# Patient Record
Sex: Female | Born: 1960 | Hispanic: Yes | Marital: Married | Smoking: Never smoker
Health system: Southern US, Community
[De-identification: ages and names within clinical notes are randomized; demographics above are authoritative.]

## PROBLEM LIST (undated history)

## (undated) DIAGNOSIS — E785 Hyperlipidemia, unspecified: Secondary | ICD-10-CM

## (undated) DIAGNOSIS — E119 Type 2 diabetes mellitus without complications: Secondary | ICD-10-CM

## (undated) HISTORY — DX: Type 2 diabetes mellitus without complications: E11.9

## (undated) HISTORY — PX: TUBAL LIGATION: SHX77

## (undated) HISTORY — DX: Hyperlipidemia, unspecified: E78.5

---

## 2012-01-19 LAB — HM DIABETES EYE EXAM

## 2012-10-31 LAB — HM PAP SMEAR

## 2013-01-18 ENCOUNTER — Telehealth: Payer: Self-pay | Admitting: Internal Medicine

## 2013-01-18 ENCOUNTER — Encounter: Payer: Self-pay | Admitting: Internal Medicine

## 2013-01-18 ENCOUNTER — Other Ambulatory Visit (INDEPENDENT_AMBULATORY_CARE_PROVIDER_SITE_OTHER): Payer: BC Managed Care – PPO

## 2013-01-18 ENCOUNTER — Ambulatory Visit (INDEPENDENT_AMBULATORY_CARE_PROVIDER_SITE_OTHER): Payer: BC Managed Care – PPO | Admitting: Internal Medicine

## 2013-01-18 VITALS — BP 128/96 | HR 92 | Temp 98.0°F | Resp 16 | Ht 61.0 in | Wt 154.0 lb

## 2013-01-18 DIAGNOSIS — E1139 Type 2 diabetes mellitus with other diabetic ophthalmic complication: Secondary | ICD-10-CM | POA: Insufficient documentation

## 2013-01-18 DIAGNOSIS — Z0000 Encounter for general adult medical examination without abnormal findings: Secondary | ICD-10-CM

## 2013-01-18 DIAGNOSIS — IMO0001 Reserved for inherently not codable concepts without codable children: Secondary | ICD-10-CM

## 2013-01-18 DIAGNOSIS — Z23 Encounter for immunization: Secondary | ICD-10-CM

## 2013-01-18 DIAGNOSIS — E1169 Type 2 diabetes mellitus with other specified complication: Secondary | ICD-10-CM | POA: Insufficient documentation

## 2013-01-18 DIAGNOSIS — E785 Hyperlipidemia, unspecified: Secondary | ICD-10-CM

## 2013-01-18 DIAGNOSIS — Z1231 Encounter for screening mammogram for malignant neoplasm of breast: Secondary | ICD-10-CM | POA: Insufficient documentation

## 2013-01-18 LAB — HEMOGLOBIN A1C: Hgb A1c MFr Bld: 11.8 % — ABNORMAL HIGH (ref 4.6–6.5)

## 2013-01-18 LAB — LDL CHOLESTEROL, DIRECT: Direct LDL: 111.5 mg/dL

## 2013-01-18 LAB — URINALYSIS, ROUTINE W REFLEX MICROSCOPIC
Bilirubin Urine: NEGATIVE
Ketones, ur: NEGATIVE
Total Protein, Urine: NEGATIVE
pH: 5 (ref 5.0–8.0)

## 2013-01-18 LAB — MICROALBUMIN / CREATININE URINE RATIO: Microalb, Ur: 2.2 mg/dL — ABNORMAL HIGH (ref 0.0–1.9)

## 2013-01-18 LAB — CBC WITH DIFFERENTIAL/PLATELET
Basophils Relative: 0.7 % (ref 0.0–3.0)
Eosinophils Relative: 2.2 % (ref 0.0–5.0)
Hemoglobin: 14.2 g/dL (ref 12.0–15.0)
Lymphocytes Relative: 31.2 % (ref 12.0–46.0)
MCV: 83.9 fl (ref 78.0–100.0)
Monocytes Absolute: 0.4 10*3/uL (ref 0.1–1.0)
Neutro Abs: 4.8 10*3/uL (ref 1.4–7.7)
Neutrophils Relative %: 61.2 % (ref 43.0–77.0)
RBC: 5.01 Mil/uL (ref 3.87–5.11)
WBC: 7.9 10*3/uL (ref 4.5–10.5)

## 2013-01-18 LAB — LIPID PANEL
HDL: 38.8 mg/dL — ABNORMAL LOW (ref >39.00)
Total CHOL/HDL Ratio: 5
Triglycerides: 219 mg/dL — ABNORMAL HIGH (ref 0.0–149.0)

## 2013-01-18 LAB — COMPREHENSIVE METABOLIC PANEL
Albumin: 3.8 g/dL (ref 3.5–5.2)
BUN: 12 mg/dL (ref 6–23)
Calcium: 9.1 mg/dL (ref 8.4–10.5)
Chloride: 101 mEq/L (ref 96–112)
Glucose, Bld: 281 mg/dL — ABNORMAL HIGH (ref 70–99)
Potassium: 4.3 mEq/L (ref 3.5–5.1)

## 2013-01-18 LAB — TSH: TSH: 1.55 u[IU]/mL (ref 0.35–5.50)

## 2013-01-18 LAB — HM DIABETES FOOT EXAM

## 2013-01-18 MED ORDER — SITAGLIP PHOS-METFORMIN HCL ER 100-1000 MG PO TB24: 1.0000 | ORAL_TABLET | Freq: Every day | ORAL | Status: DC

## 2013-01-18 MED ORDER — ONETOUCH VERIO IQ SYSTEM W/DEVICE KIT: 1.0000 | PACK | Freq: Two times a day (BID) | Status: DC

## 2013-01-18 MED ORDER — GLUCOSE BLOOD VI STRP: ORAL_STRIP | Status: DC

## 2013-01-18 NOTE — Assessment & Plan Note (Signed)
[facility] CMP today 

## 2013-01-18 NOTE — Assessment & Plan Note (Signed)
Exam done  Vaccines were updated, she refused a flu vax She was referred for a colonoscopy and mammo PAP was done 2 months ago by her GYN Labs ordered Pt ed material was given

## 2013-01-18 NOTE — Telephone Encounter (Signed)
Rec'd from [facility] forward 29 pages to Dr. Yetta Barre

## 2013-01-18 NOTE — Progress Notes (Signed)
Subjective:    Patient ID: Sharon Kane, female    DOB: 11-04-1960, 52 y.o.   MRN: [id number]  Diabetes She presents for her follow-up diabetic visit. She has type 2 diabetes mellitus. The initial diagnosis of diabetes was made 13 years ago. Her disease course has been fluctuating. There are no hypoglycemic associated symptoms. Pertinent negatives for hypoglycemia include no dizziness, headaches, seizures, speech difficulty or tremors. Pertinent negatives for diabetes include no blurred vision, no chest pain, no fatigue, no foot paresthesias, no foot ulcerations, no polydipsia, no polyphagia, no polyuria, no visual change, no weakness and no weight loss. There are no hypoglycemic complications. Symptoms are stable. There are no diabetic complications. Current diabetic treatment includes oral agent (monotherapy). She is compliant with treatment most of the time. Her weight is stable. She is following a generally healthy diet. Meal planning includes avoidance of concentrated sweets. She has not had a previous visit with a dietician. She participates in exercise intermittently. There is no change in her home blood glucose trend. An ACE inhibitor/angiotensin II receptor blocker is not being taken. She does not see a podiatrist.Eye exam is not current.      Review of Systems  Constitutional: Negative.  Negative for fever, chills, weight loss, diaphoresis, activity change, appetite change, fatigue and unexpected weight change.  HENT: Negative.   Eyes: Negative.  Negative for blurred vision.  Respiratory: Negative.  Negative for apnea, cough, choking, chest tightness, shortness of breath, wheezing and stridor.   Cardiovascular: Negative.  Negative for chest pain, palpitations and leg swelling.  Gastrointestinal: Positive for diarrhea (she feels like the TID dosing of metformin causes diarrhea). Negative for nausea, vomiting, abdominal pain, constipation, blood in stool, abdominal distention, anal  bleeding and rectal pain.  Endocrine: Negative.  Negative for polydipsia, polyphagia and polyuria.  Genitourinary: Negative.   Musculoskeletal: Negative.   Skin: Negative.   Allergic/Immunologic: Negative.   Neurological: Negative.  Negative for dizziness, tremors, seizures, syncope, facial asymmetry, speech difficulty, weakness, light-headedness, numbness and headaches.  Hematological: Negative.  Negative for adenopathy. Does not bruise/bleed easily.  Psychiatric/Behavioral: Negative.        Objective:   Physical Exam  Vitals reviewed. Constitutional: She is oriented to person, place, and time. She appears well-developed and well-nourished. No distress.  HENT:  Head: Normocephalic and atraumatic.  Mouth/Throat: Oropharynx is clear and moist. No oropharyngeal exudate.  Eyes: Conjunctivae are normal. Right eye exhibits no discharge. Left eye exhibits no discharge. No scleral icterus.  Neck: Normal range of motion. Neck supple. No JVD present. No tracheal deviation present. No thyromegaly present.  Cardiovascular: Normal rate, regular rhythm, normal heart sounds and intact distal pulses.  Exam reveals no gallop and no friction rub.   No murmur heard. Pulmonary/Chest: Effort normal and breath sounds normal. No accessory muscle usage or stridor. Not tachypneic. No respiratory distress. She has no decreased breath sounds. She has no wheezes. She has no rhonchi. She has no rales. Chest wall is not dull to percussion. She exhibits no mass, no tenderness, no bony tenderness, no laceration, no crepitus, no edema, no deformity, no swelling and no retraction. Right breast exhibits no inverted nipple, no mass, no nipple discharge, no skin change and no tenderness. Left breast exhibits no inverted nipple, no mass, no nipple discharge, no skin change and no tenderness. Breasts are symmetrical.  Abdominal: Soft. Bowel sounds are normal. She exhibits no distension and no mass. There is no tenderness. There  is no rebound and no guarding.  Musculoskeletal: Normal  range of motion. She exhibits no edema and no tenderness.  Lymphadenopathy:    She has no cervical adenopathy.  Neurological: She is oriented to person, place, and time.  Skin: Skin is warm and dry. No rash noted. She is not diaphoretic. No erythema. No pallor.  Psychiatric: She has a normal mood and affect. Her behavior is normal. Judgment and thought content normal.      No results found for this basename: WBC, HGB, HCT, PLT, GLUCOSE, CHOL, TRIG, HDL, LDLDIRECT, LDLCALC, ALT, AST, NA, K, CL, CREATININE, BUN, CO2, TSH, PSA, INR, GLUF, HGBA1C, MICROALBUR      Assessment & Plan:

## 2013-01-18 NOTE — Patient Instructions (Signed)
Type 2 Diabetes Mellitus, Adult Type 2 diabetes mellitus, often simply referred to as type 2 diabetes, is a long-lasting (chronic) disease. In type 2 diabetes, the pancreas does not make enough insulin (a hormone), the cells are less responsive to the insulin that is made (insulin resistance), or both. Normally, insulin moves sugars from food into the tissue cells. The tissue cells use the sugars for energy. The lack of insulin or the lack of normal response to insulin causes excess sugars to build up in the blood instead of going into the tissue cells. As a result, high blood sugar (hyperglycemia) develops. The effect of high sugar (glucose) levels can cause many complications. Type 2 diabetes was also previously called adult-onset diabetes but it can occur at any age.  RISK FACTORS  A person is predisposed to developing type 2 diabetes if someone in the family has the disease and also has one or more of the following primary risk factors:  Overweight.  An inactive lifestyle.  A history of consistently eating high-calorie foods. Maintaining a normal weight and regular physical activity can reduce the chance of developing type 2 diabetes. SYMPTOMS  A person with type 2 diabetes may not show symptoms initially. The symptoms of type 2 diabetes appear slowly. The symptoms include:  Increased thirst (polydipsia).  Increased urination (polyuria).  Increased urination during the night (nocturia).  Weight loss. This weight loss may be rapid.  Frequent, recurring infections.  Tiredness (fatigue).  Weakness.  Vision changes, such as blurred vision.  Fruity smell to your breath.  Abdominal pain.  Nausea or vomiting.  Cuts or bruises which are slow to heal.  Tingling or numbness in the hands or feet. DIAGNOSIS Type 2 diabetes is frequently not diagnosed until complications of diabetes are present. Type 2 diabetes is diagnosed when symptoms or complications are present and when blood  glucose levels are increased. Your blood glucose level may be checked by one or more of the following blood tests:  A fasting blood glucose test. You will not be allowed to eat for at least 8 hours before a blood sample is taken.  A random blood glucose test. Your blood glucose is checked at any time of the day regardless of when you ate.  A hemoglobin A1c blood glucose test. A hemoglobin A1c test provides information about blood glucose control over the previous 3 months.  An oral glucose tolerance test (OGTT). Your blood glucose is measured after you have not eaten (fasted) for 2 hours and then after you drink a glucose-containing beverage. TREATMENT   You may need to take insulin or diabetes medicine daily to keep blood glucose levels in the desired range.  You will need to match insulin dosing with exercise and healthy food choices. The treatment goal is to maintain the before meal blood sugar (preprandial glucose) level at 70 130 mg/dL. HOME CARE INSTRUCTIONS   Have your hemoglobin A1c level checked twice a year.  Perform daily blood glucose monitoring as directed by your caregiver.  Monitor urine ketones when you are ill and as directed by your caregiver.  Take your diabetes medicine or insulin as directed by your caregiver to maintain your blood glucose levels in the desired range.  Never run out of diabetes medicine or insulin. It is needed every day.  Adjust insulin based on your intake of carbohydrates. Carbohydrates can raise blood glucose levels but need to be included in your diet. Carbohydrates provide vitamins, minerals, and fiber which are an essential part of   a healthy diet. Carbohydrates are found in fruits, vegetables, whole grains, dairy products, legumes, and foods containing added sugars.    Eat healthy foods. Alternate 3 meals with 3 snacks.  Lose weight if overweight.  Carry a medical alert card or wear your medical alert jewelry.  Carry a 15 gram  carbohydrate snack with you at all times to treat low blood glucose (hypoglycemia). Some examples of 15 gram carbohydrate snacks include:  Glucose tablets, 3 or 4   Glucose gel, 15 gram tube  Raisins, 2 tablespoons (24 grams)  Jelly beans, 6  Animal crackers, 8  Regular pop, 4 ounces (120 mL)  Gummy treats, 9  Recognize hypoglycemia. Hypoglycemia occurs with blood glucose levels of 70 mg/dL and below. The risk for hypoglycemia increases when fasting or skipping meals, during or after intense exercise, and during sleep. Hypoglycemia symptoms can include:  Tremors or shakes.  Decreased ability to concentrate.  Sweating.  Increased heart rate.  Headache.  Dry mouth.  Hunger.  Irritability.  Anxiety.  Restless sleep.  Altered speech or coordination.  Confusion.  Treat hypoglycemia promptly. If you are alert and able to safely swallow, follow the 15:15 rule:  Take 15 20 grams of rapid-acting glucose or carbohydrate. Rapid-acting options include glucose gel, glucose tablets, or 4 ounces (120 mL) of fruit juice, regular soda, or low fat milk.  Check your blood glucose level 15 minutes after taking the glucose.  Take 15 20 grams more of glucose if the repeat blood glucose level is still 70 mg/dL or below.  Eat a meal or snack within 1 hour once blood glucose levels return to normal.    Be alert to polyuria and polydipsia which are early signs of hyperglycemia. An early awareness of hyperglycemia allows for prompt treatment. Treat hyperglycemia as directed by your caregiver.  Engage in at least 150 minutes of moderate-intensity physical activity a week, spread over at least 3 days of the week or as directed by your caregiver. In addition, you should engage in resistance exercise at least 2 times a week or as directed by your caregiver.  Adjust your medicine and food intake as needed if you start a new exercise or sport.  Follow your sick day plan at any time you  are unable to eat or drink as usual.  Avoid tobacco use.  Limit alcohol intake to no more than 1 drink per day for nonpregnant women and 2 drinks per day for men. You should drink alcohol only when you are also eating food. Talk with your caregiver whether alcohol is safe for you. Tell your caregiver if you drink alcohol several times a week.  Follow up with your caregiver regularly.  Schedule an eye exam soon after the diagnosis of type 2 diabetes and then annually.  Perform daily skin and foot care. Examine your skin and feet daily for cuts, bruises, redness, nail problems, bleeding, blisters, or sores. A foot exam by a caregiver should be done annually.  Brush your teeth and gums at least twice a day and floss at least once a day. Follow up with your dentist regularly.  Share your diabetes management plan with your workplace or school.  Stay up-to-date with immunizations.  Learn to manage stress.  Obtain ongoing diabetes education and support as needed.  Participate in, or seek rehabilitation as needed to maintain or improve independence and quality of life. Request a physical or occupational therapy referral if you are having foot or hand numbness or difficulties with grooming,   dressing, eating, or physical activity. SEEK MEDICAL CARE IF:   You are unable to eat food or drink fluids for more than 6 hours.  You have nausea and vomiting for more than 6 hours.  Your blood glucose level is over 240 mg/dL.  There is a change in mental status.  You develop an additional serious illness.  You have diarrhea for more than 6 hours.  You have been sick or have had a fever for a couple of days and are not getting better.  You have pain during any physical activity.  SEEK IMMEDIATE MEDICAL CARE IF:  You have difficulty breathing.  You have moderate to large ketone levels. MAKE SURE YOU:  Understand these instructions.  Will watch your condition.  Will get help right away if  you are not doing well or get worse. Document Released: 04/19/2005 Document Revised: 01/12/2012 Document Reviewed: 11/16/2011 [employer] Patient Information 2014 [employer]. Preventive Care for Adults, Female A healthy lifestyle and preventive care can promote health and wellness. Preventive health guidelines for women include the following key practices.  A routine yearly physical is a good way to check with your caregiver about your health and preventive screening. It is a chance to share any concerns and updates on your health, and to receive a thorough exam.  Visit your dentist for a routine exam and preventive care every 6 months. Brush your teeth twice a day and floss once a day. Good oral hygiene prevents tooth decay and gum disease.  The frequency of eye exams is based on your age, health, family medical history, use of contact lenses, and other factors. Follow your caregiver's recommendations for frequency of eye exams.  Eat a healthy diet. Foods like vegetables, fruits, whole grains, low-fat dairy products, and lean protein foods contain the nutrients you need without too many calories. Decrease your intake of foods high in solid fats, added sugars, and salt. Eat the right amount of calories for you.Get information about a proper diet from your caregiver, if necessary.  Regular physical exercise is one of the most important things you can do for your health. Most adults should get at least 150 minutes of moderate-intensity exercise (any activity that increases your heart rate and causes you to sweat) each week. In addition, most adults need muscle-strengthening exercises on 2 or more days a week.  Maintain a healthy weight. The body mass index (BMI) is a screening tool to identify possible weight problems. It provides an estimate of body fat based on height and weight. Your caregiver can help determine your BMI, and can help you achieve or maintain a healthy weight.For adults 20 years  and older:  A BMI below 18.5 is considered underweight.  A BMI of 18.5 to 24.9 is normal.  A BMI of 25 to 29.9 is considered overweight.  A BMI of 30 and above is considered obese.  Maintain normal blood lipids and cholesterol levels by exercising and minimizing your intake of saturated fat. Eat a balanced diet with plenty of fruit and vegetables. Blood tests for lipids and cholesterol should begin at age 20 and be repeated every 5 years. If your lipid or cholesterol levels are high, you are over 50, or you are at high risk for heart disease, you may need your cholesterol levels checked more frequently.Ongoing high lipid and cholesterol levels should be treated with medicines if diet and exercise are not effective.  If you smoke, find out from your caregiver how to quit. If you do   not use tobacco, do not start.  If you are pregnant, do not drink alcohol. If you are breastfeeding, be very cautious about drinking alcohol. If you are not pregnant and choose to drink alcohol, do not exceed 1 drink per day. One drink is considered to be 12 ounces (355 mL) of beer, 5 ounces (148 mL) of wine, or 1.5 ounces (44 mL) of liquor.  Avoid use of street drugs. Do not share needles with anyone. Ask for help if you need support or instructions about stopping the use of drugs.  High blood pressure causes heart disease and increases the risk of stroke. Your blood pressure should be checked at least every 1 to 2 years. Ongoing high blood pressure should be treated with medicines if weight loss and exercise are not effective.  If you are 55 to 53 years old, ask your caregiver if you should take aspirin to prevent strokes.  Diabetes screening involves taking a blood sample to check your fasting blood sugar level. This should be done once every 3 years, after age 45, if you are within normal weight and without risk factors for diabetes. Testing should be considered at a younger age or be carried out more frequently  if you are overweight and have at least 1 risk factor for diabetes.  Breast cancer screening is essential preventive care for women. You should practice "breast self-awareness." This means understanding the normal appearance and feel of your breasts and may include breast self-examination. Any changes detected, no matter how small, should be reported to a caregiver. Women in their 20s and 30s should have a clinical breast exam (CBE) by a caregiver as part of a regular health exam every 1 to 3 years. After age 40, women should have a CBE every year. Starting at age 40, women should consider having a mammography (breast X-ray test) every year. Women who have a family history of breast cancer should talk to their caregiver about genetic screening. Women at a high risk of breast cancer should talk to their caregivers about having magnetic resonance imaging (MRI) and a mammography every year.  The Pap test is a screening test for cervical cancer. A Pap test can show cell changes on the cervix that might become cervical cancer if left untreated. A Pap test is a procedure in which cells are obtained and examined from the lower end of the uterus (cervix).  Women should have a Pap test starting at age 21.  Between ages 21 and 29, Pap tests should be repeated every 2 years.  Beginning at age 30, you should have a Pap test every 3 years as long as the past 3 Pap tests have been normal.  Some women have medical problems that increase the chance of getting cervical cancer. Talk to your caregiver about these problems. It is especially important to talk to your caregiver if a new problem develops soon after your last Pap test. In these cases, your caregiver may recommend more frequent screening and Pap tests.  The above recommendations are the same for women who have or have not gotten the vaccine for human papillomavirus (HPV).  If you had a hysterectomy for a problem that was not cancer or a condition that could  lead to cancer, then you no longer need Pap tests. Even if you no longer need a Pap test, a regular exam is a good idea to make sure no other problems are starting.  If you are between ages 65 and 70, and you   have had normal Pap tests going back 10 years, you no longer need Pap tests. Even if you no longer need a Pap test, a regular exam is a good idea to make sure no other problems are starting.  If you have had past treatment for cervical cancer or a condition that could lead to cancer, you need Pap tests and screening for cancer for at least 20 years after your treatment.  If Pap tests have been discontinued, risk factors (such as a new sexual partner) need to be reassessed to determine if screening should be resumed.  The HPV test is an additional test that may be used for cervical cancer screening. The HPV test looks for the virus that can cause the cell changes on the cervix. The cells collected during the Pap test can be tested for HPV. The HPV test could be used to screen women aged 30 years and older, and should be used in women of any age who have unclear Pap test results. After the age of 30, women should have HPV testing at the same frequency as a Pap test.  Colorectal cancer can be detected and often prevented. Most routine colorectal cancer screening begins at the age of 50 and continues through age 75. However, your caregiver may recommend screening at an earlier age if you have risk factors for colon cancer. On a yearly basis, your caregiver may provide home test kits to check for hidden blood in the stool. Use of a small camera at the end of a tube, to directly examine the colon (sigmoidoscopy or colonoscopy), can detect the earliest forms of colorectal cancer. Talk to your caregiver about this at age 50, when routine screening begins. Direct examination of the colon should be repeated every 5 to 10 years through age 75, unless early forms of pre-cancerous polyps or small growths are  found.  Hepatitis C blood testing is recommended for all people born from 1945 through 1965 and any individual with known risks for hepatitis C.  Practice safe sex. Use condoms and avoid high-risk sexual practices to reduce the spread of sexually transmitted infections (STIs). STIs include gonorrhea, chlamydia, syphilis, trichomonas, herpes, HPV, and human immunodeficiency virus (HIV). Herpes, HIV, and HPV are viral illnesses that have no cure. They can result in disability, cancer, and death. Sexually active women aged 25 and younger should be checked for chlamydia. Older women with new or multiple partners should also be tested for chlamydia. Testing for other STIs is recommended if you are sexually active and at increased risk.  Osteoporosis is a disease in which the bones lose minerals and strength with aging. This can result in serious bone fractures. The risk of osteoporosis can be identified using a bone density scan. Women ages 65 and over and women at risk for fractures or osteoporosis should discuss screening with their caregivers. Ask your caregiver whether you should take a calcium supplement or vitamin D to reduce the rate of osteoporosis.  Menopause can be associated with physical symptoms and risks. Hormone replacement therapy is available to decrease symptoms and risks. You should talk to your caregiver about whether hormone replacement therapy is right for you.  Use sunscreen with sun protection factor (SPF) of 30 or more. Apply sunscreen liberally and repeatedly throughout the day. You should seek shade when your shadow is shorter than you. Protect yourself by wearing long sleeves, pants, a wide-brimmed hat, and sunglasses year round, whenever you are outdoors.  Once a month, do a whole body   skin exam, using a mirror to look at the skin on your back. Notify your caregiver of new moles, moles that have irregular borders, moles that are larger than a pencil eraser, or moles that have  changed in shape or color.  Stay current with required immunizations.  Influenza. You need a dose every fall (or winter). The composition of the flu vaccine changes each year, so being vaccinated once is not enough.  Pneumococcal polysaccharide. You need 1 to 2 doses if you smoke cigarettes or if you have certain chronic medical conditions. You need 1 dose at age 65 (or older) if you have never been vaccinated.  Tetanus, diphtheria, pertussis (Tdap, Td). Get 1 dose of Tdap vaccine if you are younger than age 65, are over 65 and have contact with an infant, are a healthcare worker, are pregnant, or simply want to be protected from whooping cough. After that, you need a Td booster dose every 10 years. Consult your caregiver if you have not had at least 3 tetanus and diphtheria-containing shots sometime in your life or have a deep or dirty wound.  HPV. You need this vaccine if you are a woman age 26 or younger. The vaccine is given in 3 doses over 6 months.  Measles, mumps, rubella (MMR). You need at least 1 dose of MMR if you were born in 1957 or later. You may also need a second dose.  Meningococcal. If you are age 19 to 21 and a first-year college student living in a residence hall, or have one of several medical conditions, you need to get vaccinated against meningococcal disease. You may also need additional booster doses.  Zoster (shingles). If you are age 60 or older, you should get this vaccine.  Varicella (chickenpox). If you have never had chickenpox or you were vaccinated but received only 1 dose, talk to your caregiver to find out if you need this vaccine.  Hepatitis A. You need this vaccine if you have a specific risk factor for hepatitis A virus infection or you simply wish to be protected from this disease. The vaccine is usually given as 2 doses, 6 to 18 months apart.  Hepatitis B. You need this vaccine if you have a specific risk factor for hepatitis B virus infection or you  simply wish to be protected from this disease. The vaccine is given in 3 doses, usually over 6 months. Preventive Services / Frequency Ages 19 to 39  Blood pressure check.** / Every 1 to 2 years.  Lipid and cholesterol check.** / Every 5 years beginning at age 20.  Clinical breast exam.** / Every 3 years for women in their 20s and 30s.  Pap test.** / Every 2 years from ages 21 through 29. Every 3 years starting at age 30 through age 65 or 70 with a history of 3 consecutive normal Pap tests.  HPV screening.** / Every 3 years from ages 30 through ages 65 to 70 with a history of 3 consecutive normal Pap tests.  Hepatitis C blood test.** / For any individual with known risks for hepatitis C.  Skin self-exam. / Monthly.  Influenza immunization.** / Every year.  Pneumococcal polysaccharide immunization.** / 1 to 2 doses if you smoke cigarettes or if you have certain chronic medical conditions.  Tetanus, diphtheria, pertussis (Tdap, Td) immunization. / A one-time dose of Tdap vaccine. After that, you need a Td booster dose every 10 years.  HPV immunization. / 3 doses over 6 months, if you are 26   and younger.  Measles, mumps, rubella (MMR) immunization. / You need at least 1 dose of MMR if you were born in 1957 or later. You may also need a second dose.  Meningococcal immunization. / 1 dose if you are age 19 to 21 and a first-year college student living in a residence hall, or have one of several medical conditions, you need to get vaccinated against meningococcal disease. You may also need additional booster doses.  Varicella immunization.** / Consult your caregiver.  Hepatitis A immunization.** / Consult your caregiver. 2 doses, 6 to 18 months apart.  Hepatitis B immunization.** / Consult your caregiver. 3 doses usually over 6 months. Ages 40 to 64  Blood pressure check.** / Every 1 to 2 years.  Lipid and cholesterol check.** / Every 5 years beginning at age 20.  Clinical breast  exam.** / Every year after age 40.  Mammogram.** / Every year beginning at age 40 and continuing for as long as you are in good health. Consult with your caregiver.  Pap test.** / Every 3 years starting at age 30 through age 65 or 70 with a history of 3 consecutive normal Pap tests.  HPV screening.** / Every 3 years from ages 30 through ages 65 to 70 with a history of 3 consecutive normal Pap tests.  Fecal occult blood test (FOBT) of stool. / Every year beginning at age 50 and continuing until age 75. You may not need to do this test if you get a colonoscopy every 10 years.  Flexible sigmoidoscopy or colonoscopy.** / Every 5 years for a flexible sigmoidoscopy or every 10 years for a colonoscopy beginning at age 50 and continuing until age 75.  Hepatitis C blood test.** / For all people born from 1945 through 1965 and any individual with known risks for hepatitis C.  Skin self-exam. / Monthly.  Influenza immunization.** / Every year.  Pneumococcal polysaccharide immunization.** / 1 to 2 doses if you smoke cigarettes or if you have certain chronic medical conditions.  Tetanus, diphtheria, pertussis (Tdap, Td) immunization.** / A one-time dose of Tdap vaccine. After that, you need a Td booster dose every 10 years.  Measles, mumps, rubella (MMR) immunization. / You need at least 1 dose of MMR if you were born in 1957 or later. You may also need a second dose.  Varicella immunization.** / Consult your caregiver.  Meningococcal immunization.** / Consult your caregiver.  Hepatitis A immunization.** / Consult your caregiver. 2 doses, 6 to 18 months apart.  Hepatitis B immunization.** / Consult your caregiver. 3 doses, usually over 6 months. Ages 65 and over  Blood pressure check.** / Every 1 to 2 years.  Lipid and cholesterol check.** / Every 5 years beginning at age 20.  Clinical breast exam.** / Every year after age 40.  Mammogram.** / Every year beginning at age 40 and continuing  for as long as you are in good health. Consult with your caregiver.  Pap test.** / Every 3 years starting at age 30 through age 65 or 70 with a 3 consecutive normal Pap tests. Testing can be stopped between 65 and 70 with 3 consecutive normal Pap tests and no abnormal Pap or HPV tests in the past 10 years.  HPV screening.** / Every 3 years from ages 30 through ages 65 or 70 with a history of 3 consecutive normal Pap tests. Testing can be stopped between 65 and 70 with 3 consecutive normal Pap tests and no abnormal Pap or HPV tests in the   past 10 years.  Fecal occult blood test (FOBT) of stool. / Every year beginning at age 50 and continuing until age 75. You may not need to do this test if you get a colonoscopy every 10 years.  Flexible sigmoidoscopy or colonoscopy.** / Every 5 years for a flexible sigmoidoscopy or every 10 years for a colonoscopy beginning at age 50 and continuing until age 75.  Hepatitis C blood test.** / For all people born from 1946 through 1965 and any individual with known risks for hepatitis C.  Osteoporosis screening.** / A one-time screening for women ages 65 and over and women at risk for fractures or osteoporosis.  Skin self-exam. / Monthly.  Influenza immunization.** / Every year.  Pneumococcal polysaccharide immunization.** / 1 dose at age 65 (or older) if you have never been vaccinated.  Tetanus, diphtheria, pertussis (Tdap, Td) immunization. / A one-time dose of Tdap vaccine if you are over 65 and have contact with an infant, are a healthcare worker, or simply want to be protected from whooping cough. After that, you need a Td booster dose every 10 years.  Varicella immunization.** / Consult your caregiver.  Meningococcal immunization.** / Consult your caregiver.  Hepatitis A immunization.** / Consult your caregiver. 2 doses, 6 to 18 months apart.  Hepatitis B immunization.** / Check with your caregiver. 3 doses, usually over 6 months. ** Family history  and personal history of risk and conditions may change your caregiver's recommendations. Document Released: 06/15/2001 Document Revised: 07/12/2011 Document Reviewed: 09/14/2010 ExitCare Patient Information 2014 [employer].  

## 2013-01-18 NOTE — Assessment & Plan Note (Signed)
Will change to [id number] Today I will check her A1C and her renal function She was referred for an eye exam

## 2013-02-26 ENCOUNTER — Encounter: Payer: Self-pay | Admitting: Internal Medicine

## 2013-02-27 MED ORDER — ONETOUCH VERIO IQ SYSTEM W/DEVICE KIT: 1.0000 | PACK | Freq: Two times a day (BID) | Status: DC

## 2013-02-27 MED ORDER — GLUCOSE BLOOD VI STRP: ORAL_STRIP | Status: DC

## 2013-02-27 NOTE — Addendum Note (Signed)
Addended by: Rock Nephew T on: 02/27/2013 11:20 AM   Modules accepted: Orders

## 2013-03-01 ENCOUNTER — Ambulatory Visit
Admission: RE | Admit: 2013-03-01 | Discharge: 2013-03-01 | Disposition: A | Discharge status: Home / Self Care | Payer: BC Managed Care – PPO | Source: Ambulatory Visit | Attending: Internal Medicine | Admitting: Internal Medicine

## 2013-03-01 DIAGNOSIS — Z1231 Encounter for screening mammogram for malignant neoplasm of breast: Secondary | ICD-10-CM

## 2013-03-08 ENCOUNTER — Other Ambulatory Visit: Payer: Self-pay

## 2013-03-24 ENCOUNTER — Encounter: Payer: Self-pay | Admitting: Internal Medicine

## 2013-03-26 MED ORDER — GLUCOSE BLOOD VI STRP: ORAL_STRIP | Status: DC

## 2013-03-26 MED ORDER — FREESTYLE LANCETS MISC: Status: DC

## 2013-04-16 ENCOUNTER — Encounter: Payer: Self-pay | Admitting: Internal Medicine

## 2013-04-16 MED ORDER — GLUCOSE BLOOD VI STRP: ORAL_STRIP | Status: DC

## 2013-04-16 MED ORDER — ONETOUCH VERIO IQ SYSTEM W/DEVICE KIT: 1.0000 | PACK | Freq: Two times a day (BID) | Status: DC

## 2013-04-19 ENCOUNTER — Encounter: Payer: Self-pay | Admitting: Internal Medicine

## 2013-10-01 ENCOUNTER — Encounter: Payer: Self-pay | Admitting: Internal Medicine

## 2013-10-01 DIAGNOSIS — IMO0001 Reserved for inherently not codable concepts without codable children: Secondary | ICD-10-CM

## 2013-10-01 DIAGNOSIS — E1165 Type 2 diabetes mellitus with hyperglycemia: Principal | ICD-10-CM

## 2013-10-01 MED ORDER — SITAGLIP PHOS-METFORMIN HCL ER 100-1000 MG PO TB24: 1.0000 | ORAL_TABLET | Freq: Every day | ORAL | Status: DC

## 2013-10-03 ENCOUNTER — Other Ambulatory Visit: Payer: Self-pay | Admitting: Internal Medicine

## 2013-10-03 ENCOUNTER — Encounter: Payer: Self-pay | Admitting: Internal Medicine

## 2013-10-03 DIAGNOSIS — E1165 Type 2 diabetes mellitus with hyperglycemia: Principal | ICD-10-CM

## 2013-10-03 DIAGNOSIS — IMO0001 Reserved for inherently not codable concepts without codable children: Secondary | ICD-10-CM

## 2013-10-03 MED ORDER — METFORMIN HCL 500 MG PO TABS: 1000.0000 mg | ORAL_TABLET | Freq: Two times a day (BID) | ORAL | Status: DC

## 2013-10-05 ENCOUNTER — Encounter: Payer: Self-pay | Admitting: Internal Medicine

## 2013-11-01 ENCOUNTER — Telehealth: Payer: Self-pay | Admitting: *Deleted

## 2013-11-01 DIAGNOSIS — IMO0001 Reserved for inherently not codable concepts without codable children: Secondary | ICD-10-CM

## 2013-11-01 DIAGNOSIS — E1165 Type 2 diabetes mellitus with hyperglycemia: Principal | ICD-10-CM

## 2013-11-01 NOTE — Telephone Encounter (Signed)
Left message on machine for patient to call and schedule an appointment for diabetic follow up.  Message will be sent to mychart. Lipid, a1c, bmet ordered.

## 2013-12-11 ENCOUNTER — Telehealth: Payer: Self-pay | Admitting: Internal Medicine

## 2013-12-11 NOTE — Telephone Encounter (Signed)
Tried to call patient to schedule CPE.  Last CPE was 01/18/2013.  Ring no answer.

## 2014-03-18 ENCOUNTER — Encounter: Payer: Self-pay | Admitting: Internal Medicine

## 2014-03-18 ENCOUNTER — Other Ambulatory Visit (INDEPENDENT_AMBULATORY_CARE_PROVIDER_SITE_OTHER): Payer: BC Managed Care – PPO

## 2014-03-18 ENCOUNTER — Other Ambulatory Visit: Payer: Self-pay | Admitting: *Deleted

## 2014-03-18 ENCOUNTER — Ambulatory Visit (INDEPENDENT_AMBULATORY_CARE_PROVIDER_SITE_OTHER)
Admission: RE | Admit: 2014-03-18 | Discharge: 2014-03-18 | Disposition: A | Discharge status: Home / Self Care | Payer: BC Managed Care – PPO | Source: Ambulatory Visit | Attending: Internal Medicine | Admitting: Internal Medicine

## 2014-03-18 ENCOUNTER — Telehealth: Payer: Self-pay | Admitting: Internal Medicine

## 2014-03-18 ENCOUNTER — Other Ambulatory Visit: Payer: Self-pay | Admitting: Internal Medicine

## 2014-03-18 ENCOUNTER — Ambulatory Visit (INDEPENDENT_AMBULATORY_CARE_PROVIDER_SITE_OTHER): Payer: BC Managed Care – PPO | Admitting: Internal Medicine

## 2014-03-18 VITALS — BP 122/78 | HR 89 | Temp 98.6°F | Resp 16 | Ht 61.0 in | Wt 142.0 lb

## 2014-03-18 DIAGNOSIS — R05 Cough: Secondary | ICD-10-CM

## 2014-03-18 DIAGNOSIS — E118 Type 2 diabetes mellitus with unspecified complications: Secondary | ICD-10-CM

## 2014-03-18 DIAGNOSIS — E785 Hyperlipidemia, unspecified: Secondary | ICD-10-CM

## 2014-03-18 DIAGNOSIS — J45909 Unspecified asthma, uncomplicated: Secondary | ICD-10-CM

## 2014-03-18 DIAGNOSIS — J189 Pneumonia, unspecified organism: Secondary | ICD-10-CM | POA: Insufficient documentation

## 2014-03-18 DIAGNOSIS — R059 Cough, unspecified: Secondary | ICD-10-CM

## 2014-03-18 DIAGNOSIS — J209 Acute bronchitis, unspecified: Secondary | ICD-10-CM | POA: Insufficient documentation

## 2014-03-18 LAB — URINALYSIS, ROUTINE W REFLEX MICROSCOPIC
BILIRUBIN URINE: NEGATIVE
Hgb urine dipstick: NEGATIVE
KETONES UR: NEGATIVE
Nitrite: NEGATIVE
Specific Gravity, Urine: >=1.03 — AB (ref 1.000–1.030)
Total Protein, Urine: NEGATIVE
URINE GLUCOSE: NEGATIVE
UROBILINOGEN UA: 0.2 (ref 0.0–1.0)
pH: 5.5 (ref 5.0–8.0)

## 2014-03-18 LAB — COMPREHENSIVE METABOLIC PANEL
ALK PHOS: 69 U/L (ref 39–117)
ALT: 24 U/L (ref 0–35)
AST: 22 U/L (ref 0–37)
Albumin: 3.7 g/dL (ref 3.5–5.2)
BUN: 13 mg/dL (ref 6–23)
CO2: 22 mEq/L (ref 19–32)
Calcium: 9.2 mg/dL (ref 8.4–10.5)
Chloride: 105 mEq/L (ref 96–112)
Creatinine, Ser: 0.6 mg/dL (ref 0.4–1.2)
GFR: 104.66 mL/min (ref >60.00)
Glucose, Bld: 289 mg/dL — ABNORMAL HIGH (ref 70–99)
POTASSIUM: 4.2 meq/L (ref 3.5–5.1)
Sodium: 139 mEq/L (ref 135–145)
Total Bilirubin: 0.7 mg/dL (ref 0.2–1.2)
Total Protein: 7.7 g/dL (ref 6.0–8.3)

## 2014-03-18 LAB — CBC WITH DIFFERENTIAL/PLATELET
Basophils Absolute: 0.1 10*3/uL (ref 0.0–0.1)
Basophils Relative: 1 % (ref 0.0–3.0)
EOS ABS: 0.2 10*3/uL (ref 0.0–0.7)
Eosinophils Relative: 3.4 % (ref 0.0–5.0)
HCT: 41.2 % (ref 36.0–46.0)
HEMOGLOBIN: 13.6 g/dL (ref 12.0–15.0)
Lymphocytes Relative: 45.2 % (ref 12.0–46.0)
Lymphs Abs: 2.5 10*3/uL (ref 0.7–4.0)
MCHC: 33.1 g/dL (ref 30.0–36.0)
MCV: 83.9 fl (ref 78.0–100.0)
MONO ABS: 0.4 10*3/uL (ref 0.1–1.0)
Monocytes Relative: 6.5 % (ref 3.0–12.0)
NEUTROS ABS: 2.4 10*3/uL (ref 1.4–7.7)
NEUTROS PCT: 43.9 % (ref 43.0–77.0)
Platelets: 300 10*3/uL (ref 150.0–400.0)
RBC: 4.91 Mil/uL (ref 3.87–5.11)
RDW: 14 % (ref 11.5–15.5)
WBC: 5.6 10*3/uL (ref 4.0–10.5)

## 2014-03-18 LAB — LIPID PANEL
Cholesterol: 168 mg/dL (ref 0–200)
HDL: 28.9 mg/dL — ABNORMAL LOW (ref >39.00)
LDL Cholesterol: 115 mg/dL — ABNORMAL HIGH (ref 0–99)
NONHDL: 139.1
Total CHOL/HDL Ratio: 6
Triglycerides: 119 mg/dL (ref 0.0–149.0)
VLDL: 23.8 mg/dL (ref 0.0–40.0)

## 2014-03-18 LAB — MICROALBUMIN / CREATININE URINE RATIO
CREATININE, U: 123.9 mg/dL
MICROALB UR: 2.7 mg/dL — AB (ref 0.0–1.9)
Microalb Creat Ratio: 2.2 mg/g (ref 0.0–30.0)

## 2014-03-18 LAB — GLUCOSE, POCT (MANUAL RESULT ENTRY): POC GLUCOSE: 294 mg/dL — AB (ref 70–99)

## 2014-03-18 LAB — HEMOGLOBIN A1C: HEMOGLOBIN A1C: 10.6 % — AB (ref 4.6–6.5)

## 2014-03-18 MED ORDER — GLUCOSE BLOOD VI STRP: ORAL_STRIP | Status: DC

## 2014-03-18 MED ORDER — ATORVASTATIN CALCIUM 20 MG PO TABS: 20.0000 mg | ORAL_TABLET | Freq: Every day | ORAL | Status: DC

## 2014-03-18 MED ORDER — HYDROCOD POLST-CHLORPHEN POLST 10-8 MG/5ML PO LQCR: 5.0000 mL | Freq: Two times a day (BID) | ORAL | Status: DC | PRN

## 2014-03-18 MED ORDER — CANAGLIFLOZIN 300 MG PO TABS: 300.0000 mg | ORAL_TABLET | Freq: Every day | ORAL | Status: DC

## 2014-03-18 MED ORDER — ALBUTEROL SULFATE 108 (90 BASE) MCG/ACT IN AEPB: 2.0000 | INHALATION_SPRAY | Freq: Four times a day (QID) | RESPIRATORY_TRACT | Status: DC | PRN

## 2014-03-18 MED ORDER — SITAGLIP PHOS-METFORMIN HCL ER 50-1000 MG PO TB24: 1.0000 | ORAL_TABLET | Freq: Two times a day (BID) | ORAL | Status: DC

## 2014-03-18 MED ORDER — MOXIFLOXACIN HCL 400 MG PO TABS: 400.0000 mg | ORAL_TABLET | Freq: Every day | ORAL | Status: AC

## 2014-03-18 MED ORDER — INSULIN GLARGINE 300 UNIT/ML ~~LOC~~ SOPN: 30.0000 [IU] | PEN_INJECTOR | Freq: Every day | SUBCUTANEOUS | Status: DC

## 2014-03-18 NOTE — Telephone Encounter (Signed)
Done

## 2014-03-18 NOTE — Patient Instructions (Signed)
Cough, Adult  A cough is a reflex that helps clear your throat and airways. It can help heal the body or may be a reaction to an irritated airway. A cough may only last 2 or 3 weeks (acute) or may last more than 8 weeks (chronic).  CAUSES Acute cough:  Viral or bacterial infections. Chronic cough:  Infections.  Allergies.  Asthma.  Post-nasal drip.  Smoking.  Heartburn or acid reflux.  Some medicines.  Chronic lung problems (COPD).  Cancer. SYMPTOMS   Cough.  Fever.  Chest pain.  Increased breathing rate.  High-pitched whistling sound when breathing (wheezing).  Colored mucus that you cough up (sputum). TREATMENT   A bacterial cough may be treated with antibiotic medicine.  A viral cough must run its course and will not respond to antibiotics.  Your caregiver may recommend other treatments if you have a chronic cough. HOME CARE INSTRUCTIONS   Only take over-the-counter or prescription medicines for pain, discomfort, or fever as directed by your caregiver. Use cough suppressants only as directed by your caregiver.  Use a cold steam vaporizer or humidifier in your bedroom or home to help loosen secretions.  Sleep in a semi-upright position if your cough is worse at night.  Rest as needed.  Stop smoking if you smoke. SEEK IMMEDIATE MEDICAL CARE IF:   You have pus in your sputum.  Your cough starts to worsen.  You cannot control your cough with suppressants and are losing sleep.  You begin coughing up blood.  You have difficulty breathing.  You develop pain which is getting worse or is uncontrolled with medicine.  You have a fever. MAKE SURE YOU:   Understand these instructions.  Will watch your condition.  Will get help right away if you are not doing well or get worse. Document Released: 10/16/2010 Document Revised: 07/12/2011 Document Reviewed: 10/16/2010 ExitCare Patient Information 2015 Exit[employer]. This information is not intended  to replace advice given to you by your health care provider. Make sure you discuss any questions you have with your health care provider.  

## 2014-03-18 NOTE — Telephone Encounter (Signed)
Pt has appt this morning and came back up from lab and stated she is in need of more glucose test strips.

## 2014-03-18 NOTE — Assessment & Plan Note (Signed)
Will not give her steroids in light of her hyperglycemia She will use albuterol inh as needed

## 2014-03-18 NOTE — Progress Notes (Signed)
Pre visit review using our clinic review tool, if applicable. No additional management support is needed unless otherwise documented below in the visit note. 

## 2014-03-18 NOTE — Telephone Encounter (Signed)
Pt sent email needing refill on her one touch strips...Raechel Chute/lmb

## 2014-03-18 NOTE — Progress Notes (Signed)
Subjective:    Patient ID: Sharon Kane, female    DOB: 06-25-1960, 53 y.o.   MRN: [id number]  Diabetes She presents for her follow-up diabetic visit. She has type 2 diabetes mellitus. Her disease course has been worsening. There are no hypoglycemic associated symptoms. Pertinent negatives for hypoglycemia include no headaches or sweats. Pertinent negatives for diabetes include no blurred vision, no chest pain, no fatigue, no foot paresthesias, no foot ulcerations, no polydipsia, no polyphagia, no polyuria, no visual change, no weakness and no weight loss. There are no hypoglycemic complications. There are no diabetic complications. Current diabetic treatment includes oral agent (monotherapy). She is compliant with treatment most of the time. Her weight is stable. She is following a generally healthy diet. Meal planning includes avoidance of concentrated sweets. She has not had a previous visit with a dietitian. She never participates in exercise. Her breakfast blood glucose range is generally >200 mg/dl. Her lunch blood glucose range is generally >200 mg/dl. Her dinner blood glucose range is generally >200 mg/dl. Her highest blood glucose is >200 mg/dl. Her overall blood glucose range is >200 mg/dl. An ACE inhibitor/angiotensin II receptor blocker is not being taken. She does not see a podiatrist.Eye exam is current.  Cough This is a new problem. The current episode started 1 to 4 weeks ago. The problem has been unchanged. The cough is non-productive. Associated symptoms include shortness of breath and wheezing. Pertinent negatives include no chest pain, chills, ear pain, fever, headaches, heartburn, hemoptysis, myalgias, nasal congestion, postnasal drip, rash, rhinorrhea, sore throat, sweats or weight loss. The symptoms are aggravated by cold air. She has tried OTC cough suppressant for the symptoms. The treatment provided mild relief.      Review of Systems  Constitutional: Negative.  Negative  for fever, chills, weight loss, diaphoresis, activity change, appetite change, fatigue and unexpected weight change.  HENT: Positive for voice change (mild laryngitis). Negative for ear pain, postnasal drip, rhinorrhea, sore throat and trouble swallowing.   Eyes: Negative.  Negative for blurred vision.  Respiratory: Positive for cough, shortness of breath and wheezing. Negative for apnea, hemoptysis, choking, chest tightness and stridor.   Cardiovascular: Negative.  Negative for chest pain, palpitations and leg swelling.  Gastrointestinal: Negative.  Negative for heartburn, nausea, vomiting, abdominal pain, diarrhea, constipation and blood in stool.  Endocrine: Negative.  Negative for polydipsia, polyphagia and polyuria.  Genitourinary: Negative.   Musculoskeletal: Negative.  Negative for myalgias, back pain, joint swelling and neck stiffness.  Skin: Negative.  Negative for rash.  Allergic/Immunologic: Negative.   Neurological: Negative.  Negative for weakness and headaches.  Hematological: Negative.  Negative for adenopathy. Does not bruise/bleed easily.  Psychiatric/Behavioral: Negative.        Objective:   Physical Exam  Constitutional: She is oriented to person, place, and time. She appears well-developed and well-nourished.  Non-toxic appearance. She does not have a sickly appearance. She does not appear ill. No distress.  HENT:  Head: Normocephalic and atraumatic.  Mouth/Throat: Oropharynx is clear and moist. No oropharyngeal exudate.  Eyes: Conjunctivae are normal. Right eye exhibits no discharge. Left eye exhibits no discharge. No scleral icterus.  Neck: Normal range of motion. Neck supple. No JVD present. No tracheal deviation present. No thyromegaly present.  Cardiovascular: Normal rate, regular rhythm, normal heart sounds and intact distal pulses.  Exam reveals no gallop and no friction rub.   No murmur heard. Pulmonary/Chest: Effort normal. No accessory muscle usage or stridor.  No respiratory distress. She has no  decreased breath sounds. She has wheezes in the right middle field and the left middle field. She has no rhonchi. She has no rales. She exhibits no tenderness.  Rare faint wheeze noted in bilateral mid-zones  Abdominal: Soft. Bowel sounds are normal. She exhibits no distension and no mass. There is no tenderness. There is no rebound and no guarding.  Musculoskeletal: Normal range of motion. She exhibits no edema or tenderness.  Lymphadenopathy:    She has no cervical adenopathy.  Neurological: She is oriented to person, place, and time.  Skin: Skin is warm and dry. No rash noted. She is not diaphoretic. No erythema. No pallor.  Vitals reviewed.    Lab Results  Component Value Date   WBC 7.9 01/18/2013   HGB 14.2 01/18/2013   HCT 42.1 01/18/2013   PLT 199.0 01/18/2013   GLUCOSE 281* 01/18/2013   CHOL 190 01/18/2013   TRIG 219.0* 01/18/2013   HDL 38.80* 01/18/2013   LDLDIRECT 111.5 01/18/2013   ALT 21 01/18/2013   AST 16 01/18/2013   NA 135 01/18/2013   K 4.3 01/18/2013   CL 101 01/18/2013   CREATININE 0.7 01/18/2013   BUN 12 01/18/2013   CO2 27 01/18/2013   TSH 1.55 01/18/2013   HGBA1C 11.8* 01/18/2013   MICROALBUR 2.2* 01/18/2013       Assessment & Plan:

## 2014-03-18 NOTE — Assessment & Plan Note (Signed)
I will check her CXR to see if there is PNA, mass, edema For now will treat as a viral URI and she will take tussionex as needed for the cough

## 2014-03-18 NOTE — Assessment & Plan Note (Signed)
I have asked her to start a statin Will recheck her FLP today

## 2014-03-18 NOTE — Assessment & Plan Note (Signed)
Her blood sugars are not well controlled I have asked her to change her med regimen to janumet and invokana as well as a once day dose of toujeo I will recheck her A1C today and will monitor her renal function

## 2014-03-19 ENCOUNTER — Encounter: Payer: Self-pay | Admitting: Internal Medicine

## 2014-03-19 LAB — TSH: TSH: 0.84 u[IU]/mL (ref 0.35–4.50)

## 2014-03-20 ENCOUNTER — Other Ambulatory Visit: Payer: Self-pay | Admitting: Internal Medicine

## 2014-03-20 DIAGNOSIS — J189 Pneumonia, unspecified organism: Secondary | ICD-10-CM

## 2014-03-20 MED ORDER — PROMETHAZINE-DM 6.25-15 MG/5ML PO SYRP: 5.0000 mL | ORAL_SOLUTION | Freq: Four times a day (QID) | ORAL | Status: DC | PRN

## 2014-03-22 ENCOUNTER — Encounter: Payer: Self-pay | Admitting: Internal Medicine

## 2014-04-11 ENCOUNTER — Other Ambulatory Visit: Payer: Self-pay | Admitting: *Deleted

## 2014-04-11 ENCOUNTER — Encounter: Payer: Self-pay | Admitting: Internal Medicine

## 2014-04-11 MED ORDER — ONETOUCH ULTRASOFT LANCETS MISC: 1.0000 | Freq: Two times a day (BID) | Status: DC

## 2014-04-11 NOTE — Telephone Encounter (Signed)
Sent email requesting refill on lancets...Raechel Chute/lmb

## 2014-04-12 ENCOUNTER — Other Ambulatory Visit: Payer: Self-pay | Admitting: *Deleted

## 2014-04-12 MED ORDER — ONETOUCH ULTRASOFT LANCETS MISC: 1.0000 | Freq: Two times a day (BID) | Status: DC

## 2014-04-12 NOTE — Telephone Encounter (Signed)
Rx for lancets need to be fax manually did not go electronically. Need ICD 10 code added and need md signature for insurance purpose. Since dr. Yetta BarreJones is out of office reprinted & had Dr. Macario GoldsPlot to sign.

## 2014-09-20 ENCOUNTER — Telehealth: Payer: Self-pay

## 2014-09-20 NOTE — Telephone Encounter (Signed)
Received pharmacy rejection stating that insurance will not cover Invokana  without a prior authorization. PA submitted to insurance via covermymeds, approval now pending their decision.    

## 2014-09-24 NOTE — Telephone Encounter (Signed)
Please have her come in for a recheck on her diabetes

## 2014-09-24 NOTE — Telephone Encounter (Signed)
Pt scheduled  

## 2014-09-24 NOTE — Telephone Encounter (Signed)
Received insurance denial stating that pt must try and fail ComorosFarxiga or Jardiance. Thanks

## 2014-10-02 ENCOUNTER — Ambulatory Visit (INDEPENDENT_AMBULATORY_CARE_PROVIDER_SITE_OTHER): Payer: BLUE CROSS/BLUE SHIELD | Admitting: Internal Medicine

## 2014-10-02 ENCOUNTER — Other Ambulatory Visit (INDEPENDENT_AMBULATORY_CARE_PROVIDER_SITE_OTHER): Payer: BLUE CROSS/BLUE SHIELD

## 2014-10-02 ENCOUNTER — Encounter: Payer: Self-pay | Admitting: Internal Medicine

## 2014-10-02 VITALS — BP 120/68 | HR 76 | Temp 98.1°F | Resp 16 | Wt 140.0 lb

## 2014-10-02 DIAGNOSIS — E785 Hyperlipidemia, unspecified: Secondary | ICD-10-CM | POA: Diagnosis not present

## 2014-10-02 DIAGNOSIS — Z0000 Encounter for general adult medical examination without abnormal findings: Secondary | ICD-10-CM | POA: Diagnosis not present

## 2014-10-02 DIAGNOSIS — Z1231 Encounter for screening mammogram for malignant neoplasm of breast: Secondary | ICD-10-CM

## 2014-10-02 DIAGNOSIS — E118 Type 2 diabetes mellitus with unspecified complications: Secondary | ICD-10-CM

## 2014-10-02 DIAGNOSIS — Z1211 Encounter for screening for malignant neoplasm of colon: Secondary | ICD-10-CM | POA: Insufficient documentation

## 2014-10-02 LAB — BASIC METABOLIC PANEL
BUN: 18 mg/dL (ref 6–23)
CO2: 27 mEq/L (ref 19–32)
Calcium: 9.3 mg/dL (ref 8.4–10.5)
Chloride: 105 mEq/L (ref 96–112)
Creatinine, Ser: 0.66 mg/dL (ref 0.40–1.20)
GFR: 98.99 mL/min (ref >60.00)
Glucose, Bld: 147 mg/dL — ABNORMAL HIGH (ref 70–99)
POTASSIUM: 4.4 meq/L (ref 3.5–5.1)
SODIUM: 137 meq/L (ref 135–145)

## 2014-10-02 LAB — HEMOGLOBIN A1C: HEMOGLOBIN A1C: 7.4 % — AB (ref 4.6–6.5)

## 2014-10-02 LAB — LIPID PANEL
CHOLESTEROL: 133 mg/dL (ref 0–200)
HDL: 41.2 mg/dL (ref >39.00)
LDL Cholesterol: 69 mg/dL (ref 0–99)
NonHDL: 91.8
Total CHOL/HDL Ratio: 3
Triglycerides: 112 mg/dL (ref 0.0–149.0)
VLDL: 22.4 mg/dL (ref 0.0–40.0)

## 2014-10-02 MED ORDER — EMPAGLIFLOZIN 25 MG PO TABS: 25.0000 mg | ORAL_TABLET | Freq: Every day | ORAL | Status: DC

## 2014-10-02 MED ORDER — INSULIN GLARGINE 300 UNIT/ML ~~LOC~~ SOPN: 30.0000 [IU] | PEN_INJECTOR | Freq: Every day | SUBCUTANEOUS | Status: DC

## 2014-10-02 NOTE — Assessment & Plan Note (Signed)
Exam done Vaccines were reviewed and updated She was referred for mammo and colonoscopy Labs reviewed

## 2014-10-02 NOTE — Progress Notes (Signed)
Subjective:  Patient ID: Sharon Kane, female    DOB: May 31, 1960  Age: 54 y.o. MRN: [id number]  CC: Diabetes; Hyperlipidemia; and Annual Exam   HPI Vincenzina Jagoda presents for follow up on DM2 - she is crying today as she admits that she has not been using the insulin and dose not know much about DM, she has not been able to attend education/nutrition classes because she is too busy, her recent blood sugars have been in the 123-156 range. Her insurance will not pay for invokana any more.  Outpatient Prescriptions Prior to Visit  Medication Sig Dispense Refill  . Albuterol Sulfate (PROAIR RESPICLICK) 470 (90 BASE) MCG/ACT AEPB Inhale 2 puffs into the lungs 4 (four) times daily as needed. 1 each 11  . atorvastatin (LIPITOR) 20 MG tablet Take 1 tablet (20 mg total) by mouth daily. 90 tablet 3  . Blood Glucose Monitoring Suppl (ONETOUCH VERIO IQ SYSTEM) W/DEVICE KIT 1 Act by Does not apply route 2 (two) times daily. 2 kit 0  . esomeprazole (NEXIUM) 40 MG capsule Frequency:   Dosage:0   MG  Instructions:  Note:TAKE 1 CAPSULE TWICE DAILY x 2 WEEKS: THEN 1 CAPSULE DAILY THEREAFTER    . glucose blood (ONETOUCH VERIO) test strip Dx: E11.8 Test up to BID 200 each 5  . Lancets (ONETOUCH ULTRASOFT) lancets 1 each by Other route 2 (two) times daily. Use to help check blood sugars twice a day Dx E11.8 180 each 3  . SitaGLIPtin-MetFORMIN HCl 50-1000 MG TB24 Take 1 tablet by mouth 2 (two) times daily. 180 tablet 1  . canagliflozin (INVOKANA) 300 MG TABS tablet Take 300 mg by mouth daily. 90 tablet 1  . chlorpheniramine-HYDROcodone (TUSSIONEX PENNKINETIC ER) 10-8 MG/5ML LQCR Take 5 mLs by mouth every 12 (twelve) hours as needed for cough. 115 mL 0  . Insulin Glargine (TOUJEO SOLOSTAR) 300 UNIT/ML SOPN Inject 30 Units into the skin daily. 1.5 mL 5  . promethazine-dextromethorphan (PROMETHAZINE-DM) 6.25-15 MG/5ML syrup Take 5 mLs by mouth 4 (four) times daily as needed for cough. 118 mL 0   No  facility-administered medications prior to visit.    ROS Review of Systems  Constitutional: Negative.  Negative for fever, chills, diaphoresis, appetite change and fatigue.  HENT: Negative.   Eyes: Negative.   Respiratory: Negative.  Negative for cough, choking, chest tightness, shortness of breath, wheezing and stridor.   Cardiovascular: Negative.  Negative for chest pain, palpitations and leg swelling.  Gastrointestinal: Negative.  Negative for vomiting, abdominal pain, diarrhea, constipation and blood in stool.  Endocrine: Negative.  Negative for polydipsia, polyphagia and polyuria.  Genitourinary: Negative.   Musculoskeletal: Negative.  Negative for myalgias, back pain, joint swelling and arthralgias.  Skin: Negative.  Negative for rash.  Allergic/Immunologic: Negative.   Neurological: Negative.  Negative for dizziness, tremors, syncope, speech difficulty, light-headedness, numbness and headaches.  Hematological: Negative.  Negative for adenopathy. Does not bruise/bleed easily.  Psychiatric/Behavioral: Negative.     Objective:  BP 120/68 mmHg  Pulse 76  Temp(Src) 98.1 F (36.7 C) (Oral)  Resp 16  Wt 140 lb (63.504 kg)  SpO2 98%  LMP 05/03/2000  BP Readings from Last 3 Encounters:  10/02/14 120/68  03/18/14 122/78  01/18/13 128/96    Wt Readings from Last 3 Encounters:  10/02/14 140 lb (63.504 kg)  03/18/14 142 lb (64.411 kg)  01/18/13 154 lb (69.854 kg)    Physical Exam  Constitutional: She is oriented to person, place, and time. She appears well-developed and  well-nourished. No distress.  HENT:  Head: Normocephalic and atraumatic.  Mouth/Throat: Oropharynx is clear and moist. No oropharyngeal exudate.  Eyes: Conjunctivae are normal. Right eye exhibits no discharge. Left eye exhibits no discharge. No scleral icterus.  Neck: Normal range of motion. Neck supple. No JVD present. No tracheal deviation present. No thyromegaly present.  Cardiovascular: Normal rate,  regular rhythm, normal heart sounds and intact distal pulses.  Exam reveals no gallop and no friction rub.   No murmur heard. Pulmonary/Chest: Effort normal and breath sounds normal. No stridor. No respiratory distress. She has no wheezes. She has no rales. She exhibits no tenderness.  Abdominal: Soft. Bowel sounds are normal. She exhibits no distension and no mass. There is no tenderness. There is no rebound and no guarding.  Musculoskeletal: Normal range of motion. She exhibits no edema or tenderness.  Lymphadenopathy:    She has no cervical adenopathy.  Neurological: She is oriented to person, place, and time.  Skin: Skin is warm and dry. No rash noted. She is not diaphoretic. No erythema. No pallor.  Psychiatric: She has a normal mood and affect. Her behavior is normal. Judgment and thought content normal.  Vitals reviewed.   Lab Results  Component Value Date   WBC 5.6 03/18/2014   HGB 13.6 03/18/2014   HCT 41.2 03/18/2014   PLT 300.0 03/18/2014   GLUCOSE 147* 10/02/2014   CHOL 133 10/02/2014   TRIG 112.0 10/02/2014   HDL 41.20 10/02/2014   LDLDIRECT 111.5 01/18/2013   LDLCALC 69 10/02/2014   ALT 24 03/18/2014   AST 22 03/18/2014   NA 137 10/02/2014   K 4.4 10/02/2014   CL 105 10/02/2014   CREATININE 0.66 10/02/2014   BUN 18 10/02/2014   CO2 27 10/02/2014   TSH 0.84 03/18/2014   HGBA1C 7.4* 10/02/2014   MICROALBUR 2.7* 03/18/2014    Dg Chest 2 View  03/18/2014   CLINICAL DATA:  Cough and congestion for 2 weeks. Shortness of breath.  EXAM: CHEST  2 VIEW  COMPARISON:  None.  FINDINGS: Trachea is midline. Heart size normal. Question minimal airspace disease in the left lower lobe. Lungs are otherwise clear. No pleural fluid.  IMPRESSION: Suspect minimal airspace disease in the left lower lobe, possibly due to resolving pneumonia.   Electronically Signed   By: Lorin Picket M.D.   On: 03/18/2014 12:46  Lab Results  Component Value Date   WBC 5.6 03/18/2014   HGB 13.6  03/18/2014   HCT 41.2 03/18/2014   PLT 300.0 03/18/2014   GLUCOSE 147* 10/02/2014   CHOL 133 10/02/2014   TRIG 112.0 10/02/2014   HDL 41.20 10/02/2014   LDLDIRECT 111.5 01/18/2013   LDLCALC 69 10/02/2014   ALT 24 03/18/2014   AST 22 03/18/2014   NA 137 10/02/2014   K 4.4 10/02/2014   CL 105 10/02/2014   CREATININE 0.66 10/02/2014   BUN 18 10/02/2014   CO2 27 10/02/2014   TSH 0.84 03/18/2014   HGBA1C 7.4* 10/02/2014   MICROALBUR 2.7* 03/18/2014    Assessment & Plan:   Stpehanie was seen today for diabetes.  Diagnoses and all orders for this visit:  Type II diabetes mellitus with manifestations - her A1C shows that the blood sugars have improved, will cont the current regimen Orders: -     Basic metabolic panel; Future -     Hemoglobin A1c; Future -     Lipid panel; Future -     empagliflozin (JARDIANCE) 25 MG TABS tablet;  Take 25 mg by mouth daily. -     Ambulatory referral to Ophthalmology -     Amb Referral to Nutrition and Diabetic E -     Ambulatory referral to Endocrinology -     Insulin Glargine (TOUJEO SOLOSTAR) 300 UNIT/ML SOPN; Inject 30 Units into the skin daily.  Hyperlipidemia with target LDL less than 100 - she has achieved her LDL goal and is doing well on the statin Orders: -     Lipid panel; Future  Visit for screening mammogram Orders: -     MM DIGITAL SCREENING BILATERAL; Future  Screen for colon cancer Orders: -     Ambulatory referral to Gastroenterology  I have discontinued Ms. Figueroa's canagliflozin, chlorpheniramine-HYDROcodone, and promethazine-dextromethorphan. I am also having her start on empagliflozin. Additionally, I am having her maintain her ONETOUCH VERIO IQ SYSTEM, esomeprazole, Albuterol Sulfate, SitaGLIPtin-MetFORMIN HCl, glucose blood, atorvastatin, onetouch ultrasoft, and Insulin Glargine.  Meds ordered this encounter  Medications  . empagliflozin (JARDIANCE) 25 MG TABS tablet    Sig: Take 25 mg by mouth daily.    Dispense:   90 tablet    Refill:  3  . Insulin Glargine (TOUJEO SOLOSTAR) 300 UNIT/ML SOPN    Sig: Inject 30 Units into the skin daily.    Dispense:  1.5 mL    Refill:  5     Follow-up: Return in about 4 months (around 02/01/2015).  Thomas Jones, MD  

## 2014-10-02 NOTE — Patient Instructions (Signed)

## 2014-10-07 ENCOUNTER — Encounter: Payer: Self-pay | Admitting: Internal Medicine

## 2014-10-08 ENCOUNTER — Encounter: Payer: Self-pay | Admitting: Family

## 2014-10-08 ENCOUNTER — Ambulatory Visit (INDEPENDENT_AMBULATORY_CARE_PROVIDER_SITE_OTHER)
Admission: RE | Admit: 2014-10-08 | Discharge: 2014-10-08 | Disposition: A | Discharge status: Home / Self Care | Payer: BLUE CROSS/BLUE SHIELD | Source: Ambulatory Visit | Attending: Family | Admitting: Family

## 2014-10-08 ENCOUNTER — Ambulatory Visit (INDEPENDENT_AMBULATORY_CARE_PROVIDER_SITE_OTHER): Payer: BLUE CROSS/BLUE SHIELD | Admitting: Family

## 2014-10-08 VITALS — BP 130/82 | HR 82 | Temp 98.5°F | Resp 18 | Ht 61.0 in | Wt 140.1 lb

## 2014-10-08 DIAGNOSIS — M25511 Pain in right shoulder: Secondary | ICD-10-CM

## 2014-10-08 MED ORDER — NAPROXEN 500 MG PO TABS: 500.0000 mg | ORAL_TABLET | Freq: Two times a day (BID) | ORAL | Status: DC

## 2014-10-08 NOTE — Patient Instructions (Addendum)
Thank you for choosing [facility].  Summary/Instructions:  Please start taking the Naproxen Sodium twice daily.  Recommend a sling for your right arm.   Your prescription(s) have been submitted to your pharmacy or been printed and provided for you. Please take as directed and contact our office if you believe you are having problem(s) with the medication(s) or have any questions.  Please stop by radiology on the basement level of the building for your x-rays. Your results will be released to MyChart (or called to you) after review, usually within 72 hours after test completion. If any treatments or changes are necessary, you will be notified at that same time.  Referrals have been made during this visit. You should expect to hear back from our schedulers in about 7-10 days in regards to establishing an appointment with the specialists we discussed.   If your symptoms worsen or fail to improve, please contact our office for further instruction, or in case of emergency go directly to the emergency room at the closest medical facility.   RICE: Routine Care for Injuries The routine care of many injuries includes Rest, Ice, Compression, and Elevation (RICE). HOME CARE INSTRUCTIONS  Rest is needed to allow your body to heal. Routine activities can usually be resumed when comfortable. Injured tendons and bones can take up to 6 weeks to heal. Tendons are the cord-like structures that attach muscle to bone.  Ice following an injury helps keep the swelling down and reduces pain.  Put ice in a plastic bag.  Place a towel between your skin and the bag.  Leave the ice on for 15-20 minutes, 3-4 times a day, or as directed by your health care provider. Do this while awake, for the first 24 to 48 hours. After that, continue as directed by your caregiver.  Compression helps keep swelling down. It also gives support and helps with discomfort. If an elastic bandage has been applied, it should be  removed and reapplied every 3 to 4 hours. It should not be applied tightly, but firmly enough to keep swelling down. Watch fingers or toes for swelling, bluish discoloration, coldness, numbness, or excessive pain. If any of these problems occur, remove the bandage and reapply loosely. Contact your caregiver if these problems continue.  Elevation helps reduce swelling and decreases pain. With extremities, such as the arms, hands, legs, and feet, the injured area should be placed near or above the level of the heart, if possible. SEEK IMMEDIATE MEDICAL CARE IF:  You have persistent pain and swelling.  You develop redness, numbness, or unexpected weakness.  Your symptoms are getting worse rather than improving after several days. These symptoms may indicate that further evaluation or further X-rays are needed. Sometimes, X-rays may not show a small broken bone (fracture) until 1 week or 10 days later. Make a follow-up appointment with your caregiver. Ask when your X-ray results will be ready. Make sure you get your X-ray results. Document Released: 08/01/2000 Document Revised: 04/24/2013 Document Reviewed: 09/18/2010 [facility] Patient Information 2015 [employer], [place]. This information is not intended to replace advice given to you by your health care provider. Make sure you discuss any questions you have with your health care provider.

## 2014-10-08 NOTE — Progress Notes (Signed)
Pre visit review using our clinic review tool, if applicable. No additional management support is needed unless otherwise documented below in the visit note. 

## 2014-10-08 NOTE — Progress Notes (Signed)
Subjective:    Patient ID: Sharon Kane, female    DOB: 17-Dec-1960, 54 y.o.   MRN: [id number]  Chief Complaint  Patient presents with  . Shoulder Pain    says her right shoulder is in pain, can barely lift her arm and states that it is worse at night, x2 weeks    HPI:  Sharon Kane is a 54 y.o. female with a PMH of type 2 diabetes and hyperlipidemia who presents today for an acute office visit.  This is a new problem. Associated symptom of pain located in her right shoulder has been going on for approximately 2 weeks. Timing of the symptoms is worse at night. Indicates she can barely lift her arm. Describes the pain as sharp and tenderness to the touch with a severity of 11/10 at its worse. Modifying factors include Advil and Aleve which have helped a little. Functionality is decreased secondary to inability to lift her arm which complicates her activities of daily living. Denies any specific trauma to the area.   No Known Allergies  Current Outpatient Prescriptions on File Prior to Visit  Medication Sig Dispense Refill  . Albuterol Sulfate (PROAIR RESPICLICK) 147 (90 BASE) MCG/ACT AEPB Inhale 2 puffs into the lungs 4 (four) times daily as needed. 1 each 11  . atorvastatin (LIPITOR) 20 MG tablet Take 1 tablet (20 mg total) by mouth daily. 90 tablet 3  . Blood Glucose Monitoring Suppl (ONETOUCH VERIO IQ SYSTEM) W/DEVICE KIT 1 Act by Does not apply route 2 (two) times daily. 2 kit 0  . empagliflozin (JARDIANCE) 25 MG TABS tablet Take 25 mg by mouth daily. 90 tablet 3  . esomeprazole (NEXIUM) 40 MG capsule Frequency:   Dosage:0   MG  Instructions:  Note:TAKE 1 CAPSULE TWICE DAILY x 2 WEEKS: THEN 1 CAPSULE DAILY THEREAFTER    . glucose blood (ONETOUCH VERIO) test strip Dx: E11.8 Test up to BID 200 each 5  . Insulin Glargine (TOUJEO SOLOSTAR) 300 UNIT/ML SOPN Inject 30 Units into the skin daily. 1.5 mL 5  . Lancets (ONETOUCH ULTRASOFT) lancets 1 each by Other route 2 (two) times  daily. Use to help check blood sugars twice a day Dx E11.8 180 each 3  . SitaGLIPtin-MetFORMIN HCl 50-1000 MG TB24 Take 1 tablet by mouth 2 (two) times daily. 180 tablet 1   No current facility-administered medications on file prior to visit.    Past Medical History  Diagnosis Date  . Diabetes mellitus without complication     Review of Systems  Constitutional: Negative for fever and chills.  Musculoskeletal: Positive for arthralgias.  Neurological: Negative for numbness.      Objective:    BP 130/82 mmHg  Pulse 82  Temp(Src) 98.5 F (36.9 C) (Oral)  Resp 18  Ht $R'5\' 1"'pj$  (1.549 m)  Wt 140 lb 1.9 oz (63.558 kg)  BMI 26.49 kg/m2  SpO2 96%  LMP 05/03/2000 Nursing note and vital signs reviewed.  Physical Exam  Constitutional: She is oriented to person, place, and time. She appears well-developed and well-nourished. No distress.  Cardiovascular: Normal rate, regular rhythm, normal heart sounds and intact distal pulses.   Pulmonary/Chest: Effort normal and breath sounds normal.  Musculoskeletal:  Right shoulder: No obvious deformity or discoloration. Moderate amount of edema present. Palpable tenderness along supraspinatus insertion and proximal deltoid. Range of motion is severely limited secondary to pain in flexion and abduction. Range of motion is slightly increased with passive range of motion, however still limited. Distal pulses  are intact and appropriate. Special test exam incomplete secondary to pain and limited range of motion.  Neurological: She is alert and oriented to person, place, and time.  Skin: Skin is warm and dry.  Psychiatric: She has a normal mood and affect. Her behavior is normal. Judgment and thought content normal.       Assessment & Plan:   Problem List Items Addressed This Visit      Other   Right shoulder pain - Primary    Symptoms and exam consistent with possible bursitis or rotator cuff pathology. Although mechanism is questionable. Obtain  x-ray to rule out calcification. Start naproxen. Refer to orthopedics for further evaluation. Continue conservative treatment at this time with ice and sling as needed. Pending x-ray results may require MRI. Follow-up if symptoms worsen or fail to improve.      Relevant Orders   DG Shoulder Right (Completed)   AMB referral to orthopedics

## 2014-10-08 NOTE — Assessment & Plan Note (Signed)
Symptoms and exam consistent with possible bursitis or rotator cuff pathology. Although mechanism is questionable. Obtain x-ray to rule out calcification. Start naproxen. Refer to orthopedics for further evaluation. Continue conservative treatment at this time with ice and sling as needed. Pending x-ray results may require MRI. Follow-up if symptoms worsen or fail to improve.

## 2014-10-09 ENCOUNTER — Encounter: Payer: Self-pay | Admitting: Family

## 2014-10-23 ENCOUNTER — Telehealth: Payer: Self-pay | Admitting: Family

## 2014-10-23 NOTE — Telephone Encounter (Signed)
Pt aware of results. Will call back if things worsen. She said she is doing better on the medication that was prescribed.

## 2014-10-23 NOTE — Telephone Encounter (Signed)
Please inform patient that her x-ray results show no evidence of fracture but there is some calcification or bone growth located within your rotator cuff muscle. Therefore please continue with the current treatment and follow up with orthopedics. Please let me know if you have any questions.

## 2014-12-10 ENCOUNTER — Encounter: Payer: Self-pay | Admitting: Internal Medicine

## 2015-02-18 ENCOUNTER — Ambulatory Visit (INDEPENDENT_AMBULATORY_CARE_PROVIDER_SITE_OTHER): Payer: BLUE CROSS/BLUE SHIELD | Admitting: Family

## 2015-02-18 ENCOUNTER — Encounter: Payer: Self-pay | Admitting: Family

## 2015-02-18 ENCOUNTER — Ambulatory Visit (INDEPENDENT_AMBULATORY_CARE_PROVIDER_SITE_OTHER)
Admission: RE | Admit: 2015-02-18 | Discharge: 2015-02-18 | Disposition: A | Discharge status: Home / Self Care | Payer: BLUE CROSS/BLUE SHIELD | Source: Ambulatory Visit | Attending: Family | Admitting: Family

## 2015-02-18 VITALS — BP 132/90 | HR 78 | Temp 98.2°F | Resp 18 | Ht 61.0 in | Wt 144.1 lb

## 2015-02-18 DIAGNOSIS — M5430 Sciatica, unspecified side: Secondary | ICD-10-CM | POA: Insufficient documentation

## 2015-02-18 DIAGNOSIS — M5432 Sciatica, left side: Secondary | ICD-10-CM

## 2015-02-18 MED ORDER — PREDNISONE 10 MG (21) PO TBPK: ORAL_TABLET | ORAL | Status: DC

## 2015-02-18 NOTE — Assessment & Plan Note (Addendum)
Symptoms and exam consistent with sciatica possibly related to disc pathology. Obtain lumbar x-rays. Start prednisone taper. Instructions on blood sugar management provided if needed. Start heat and home exercise therapy. Follow up pending x-ray results or worsening of symptoms.

## 2015-02-18 NOTE — Patient Instructions (Addendum)
Thank you for choosing [facility].  Summary/Instructions:  For your back, heat and stretch as instructed below.   Start the prednisone taper. This may temporarily increase your blood sugars.  Your prescription(s) have been submitted to your pharmacy or been printed and provided for you. Please take as directed and contact our office if you believe you are having problem(s) with the medication(s) or have any questions.  Please stop by radiology on the basement level of the building for your x-rays. Your results will be released to MyChart (or called to you) after review, usually within 72 hours after test completion. If any treatments or changes are necessary, you will be notified at that same time.  If your symptoms worsen or fail to improve, please contact our office for further instruction, or in case of emergency go directly to the emergency room at the closest medical facility.    Low Back Sprain With Rehab A sprain is an injury in which a ligament is torn. The ligaments of the lower back are vulnerable to sprains. However, they are strong and require great force to be injured. These ligaments are important for stabilizing the spinal column. Sprains are classified into three categories. Grade 1 sprains cause pain, but the tendon is not lengthened. Grade 2 sprains include a lengthened ligament, due to the ligament being stretched or partially ruptured. With grade 2 sprains there is still function, although the function may be decreased. Grade 3 sprains involve a complete tear of the tendon or muscle, and function is usually impaired. SYMPTOMS   Severe pain in the lower back.  Sometimes, a feeling of a "pop," "snap," or tear, at the time of injury.  Tenderness and sometimes swelling at the injury site.  Uncommonly, bruising (contusion) within 48 hours of injury.  Muscle spasms in the back. CAUSES  Low back sprains occur when a force is placed on the ligaments that is greater  than they can handle. Common causes of injury include:  Performing a stressful act while off-balance.  Repetitive stressful activities that involve movement of the lower back.  Direct hit (trauma) to the lower back. RISK INCREASES WITH:  Contact sports (football, wrestling).  Collisions (major skiing accidents).  Sports that require throwing or lifting (baseball, weightlifting).  Sports involving twisting of the spine (gymnastics, diving, tennis, golf).  Poor strength and flexibility.  Inadequate protection.  Previous back injury or surgery (especially fusion). PREVENTION  Wear properly fitted and padded protective equipment.  Warm up and stretch properly before activity.  Allow for adequate recovery between workouts.  Maintain physical fitness:  Strength, flexibility, and endurance.  Cardiovascular fitness.  Maintain a healthy body weight. PROGNOSIS  If treated properly, low back sprains usually heal with non-surgical treatment. The length of time for healing depends on the severity of the injury.  RELATED COMPLICATIONS   Recurring symptoms, resulting in a chronic problem.  Chronic inflammation and pain in the low back.  Delayed healing or resolution of symptoms, especially if activity is resumed too soon.  Prolonged impairment.  Unstable or arthritic joints of the low back. TREATMENT  Treatment first involves the use of ice and medicine, to reduce pain and inflammation. The use of strengthening and stretching exercises may help reduce pain with activity. These exercises may be performed at home or with a therapist. Severe injuries may require referral to a therapist for further evaluation and treatment, such as ultrasound. Your caregiver may advise that you wear a back brace or corset, to help reduce  pain and discomfort. Often, prolonged bed rest results in greater harm then benefit. Corticosteroid injections may be recommended. However, these should be reserved  for the most serious cases. It is important to avoid using your back when lifting objects. At night, sleep on your back on a firm mattress, with a pillow placed under your knees. If non-surgical treatment is unsuccessful, surgery may be needed.  MEDICATION   If pain medicine is needed, nonsteroidal anti-inflammatory medicines (aspirin and ibuprofen), or other minor pain relievers (acetaminophen), are often advised.  Do not take pain medicine for 7 days before surgery.  Prescription pain relievers may be given, if your caregiver thinks they are needed. Use only as directed and only as much as you need.  Ointments applied to the skin may be helpful.  Corticosteroid injections may be given by your caregiver. These injections should be reserved for the most serious cases, because they may only be given a certain number of times. HEAT AND COLD  Cold treatment (icing) should be applied for 10 to 15 minutes every 2 to 3 hours for inflammation and pain, and immediately after activity that aggravates your symptoms. Use ice packs or an ice massage.  Heat treatment may be used before performing stretching and strengthening activities prescribed by your caregiver, physical therapist, or athletic trainer. Use a heat pack or a warm water soak. SEEK MEDICAL CARE IF:   Symptoms get worse or do not improve in 2 to 4 weeks, despite treatment.  You develop numbness or weakness in either leg.  You lose bowel or bladder function.  Any of the following occur after surgery: fever, increased pain, swelling, redness, drainage of fluids, or bleeding in the affected area.  New, unexplained symptoms develop. (Drugs used in treatment may produce side effects.) EXERCISES  RANGE OF MOTION (ROM) AND STRETCHING EXERCISES - Low Back Sprain Most people with lower back pain will find that their symptoms get worse with excessive bending forward (flexion) or arching at the lower back (extension). The exercises that will  help resolve your symptoms will focus on the opposite motion.  Your physician, physical therapist or athletic trainer will help you determine which exercises will be most helpful to resolve your lower back pain. Do not complete any exercises without first consulting with your caregiver. Discontinue any exercises which make your symptoms worse, until you speak to your caregiver. If you have pain, numbness or tingling which travels down into your buttocks, leg or foot, the goal of the therapy is for these symptoms to move closer to your back and eventually resolve. Sometimes, these leg symptoms will get better, but your lower back pain may worsen. This is often an indication of progress in your rehabilitation. Be very alert to any changes in your symptoms and the activities in which you participated in the 24 hours prior to the change. Sharing this information with your caregiver will allow him or her to most efficiently treat your condition. These exercises may help you when beginning to rehabilitate your injury. Your symptoms may resolve with or without further involvement from your physician, physical therapist or athletic trainer. While completing these exercises, remember:   Restoring tissue flexibility helps normal motion to return to the joints. This allows healthier, less painful movement and activity.  An effective stretch should be held for at least 30 seconds.  A stretch should never be painful. You should only feel a gentle lengthening or release in the stretched tissue. FLEXION RANGE OF MOTION AND STRETCHING EXERCISES: STRETCH -  Flexion, Single Knee to Chest   Lie on a firm bed or floor with both legs extended in front of you.  Keeping one leg in contact with the floor, bring your opposite knee to your chest. Hold your leg in place by either grabbing behind your thigh or at your knee.  Pull until you feel a gentle stretch in your low back. Hold __________ seconds.  Slowly release your  grasp and repeat the exercise with the opposite side. Repeat __________ times. Complete this exercise __________ times per day.  STRETCH - Flexion, Double Knee to Chest  Lie on a firm bed or floor with both legs extended in front of you.  Keeping one leg in contact with the floor, bring your opposite knee to your chest.  Tense your stomach muscles to support your back and then lift your other knee to your chest. Hold your legs in place by either grabbing behind your thighs or at your knees.  Pull both knees toward your chest until you feel a gentle stretch in your low back. Hold __________ seconds.  Tense your stomach muscles and slowly return one leg at a time to the floor. Repeat __________ times. Complete this exercise __________ times per day.  STRETCH - Low Trunk Rotation  Lie on a firm bed or floor. Keeping your legs in front of you, bend your knees so they are both pointed toward the ceiling and your feet are flat on the floor.  Extend your arms out to the side. This will stabilize your upper body by keeping your shoulders in contact with the floor.  Gently and slowly drop both knees together to one side until you feel a gentle stretch in your low back. Hold for __________ seconds.  Tense your stomach muscles to support your lower back as you bring your knees back to the starting position. Repeat the exercise to the other side. Repeat __________ times. Complete this exercise __________ times per day  EXTENSION RANGE OF MOTION AND FLEXIBILITY EXERCISES: STRETCH - Extension, Prone on Elbows   Lie on your stomach on the floor, a bed will be too soft. Place your palms about shoulder width apart and at the height of your head.  Place your elbows under your shoulders. If this is too painful, stack pillows under your chest.  Allow your body to relax so that your hips drop lower and make contact more completely with the floor.  Hold this position for __________ seconds.  Slowly  return to lying flat on the floor. Repeat __________ times. Complete this exercise __________ times per day.  RANGE OF MOTION - Extension, Prone Press Ups  Lie on your stomach on the floor, a bed will be too soft. Place your palms about shoulder width apart and at the height of your head.  Keeping your back as relaxed as possible, slowly straighten your elbows while keeping your hips on the floor. You may adjust the placement of your hands to maximize your comfort. As you gain motion, your hands will come more underneath your shoulders.  Hold this position __________ seconds.  Slowly return to lying flat on the floor. Repeat __________ times. Complete this exercise __________ times per day.  RANGE OF MOTION- Quadruped, Neutral Spine   Assume a hands and knees position on a firm surface. Keep your hands under your shoulders and your knees under your hips. You may place padding under your knees for comfort.  Drop your head and point your tailbone toward the ground below  you. This will round out your lower back like an angry cat. Hold this position for __________ seconds.  Slowly lift your head and release your tail bone so that your back sags into a large arch, like an old horse.  Hold this position for __________ seconds.  Repeat this until you feel limber in your low back.  Now, find your "sweet spot." This will be the most comfortable position somewhere between the two previous positions. This is your neutral spine. Once you have found this position, tense your stomach muscles to support your low back.  Hold this position for __________ seconds. Repeat __________ times. Complete this exercise __________ times per day.  STRENGTHENING EXERCISES - Low Back Sprain These exercises may help you when beginning to rehabilitate your injury. These exercises should be done near your "sweet spot." This is the neutral, low-back arch, somewhere between fully rounded and fully arched, that is your  least painful position. When performed in this safe range of motion, these exercises can be used for people who have either a flexion or extension based injury. These exercises may resolve your symptoms with or without further involvement from your physician, physical therapist or athletic trainer. While completing these exercises, remember:   Muscles can gain both the endurance and the strength needed for everyday activities through controlled exercises.  Complete these exercises as instructed by your physician, physical therapist or athletic trainer. Increase the resistance and repetitions only as guided.  You may experience muscle soreness or fatigue, but the pain or discomfort you are trying to eliminate should never worsen during these exercises. If this pain does worsen, stop and make certain you are following the directions exactly. If the pain is still present after adjustments, discontinue the exercise until you can discuss the trouble with your caregiver. STRENGTHENING - Deep Abdominals, Pelvic Tilt   Lie on a firm bed or floor. Keeping your legs in front of you, bend your knees so they are both pointed toward the ceiling and your feet are flat on the floor.  Tense your lower abdominal muscles to press your low back into the floor. This motion will rotate your pelvis so that your tail bone is scooping upwards rather than pointing at your feet or into the floor. With a gentle tension and even breathing, hold this position for __________ seconds. Repeat __________ times. Complete this exercise __________ times per day.  STRENGTHENING - Abdominals, Crunches   Lie on a firm bed or floor. Keeping your legs in front of you, bend your knees so they are both pointed toward the ceiling and your feet are flat on the floor. Cross your arms over your chest.  Slightly tip your chin down without bending your neck.  Tense your abdominals and slowly lift your trunk high enough to just clear your  shoulder blades. Lifting higher can put excessive stress on the lower back and does not further strengthen your abdominal muscles.  Control your return to the starting position. Repeat __________ times. Complete this exercise __________ times per day.  STRENGTHENING - Quadruped, Opposite UE/LE Lift   Assume a hands and knees position on a firm surface. Keep your hands under your shoulders and your knees under your hips. You may place padding under your knees for comfort.  Find your neutral spine and gently tense your abdominal muscles so that you can maintain this position. Your shoulders and hips should form a rectangle that is parallel with the floor and is not twisted.  Keeping your  trunk steady, lift your right hand no higher than your shoulder and then your left leg no higher than your hip. Make sure you are not holding your breath. Hold this position for __________ seconds.  Continuing to keep your abdominal muscles tense and your back steady, slowly return to your starting position. Repeat with the opposite arm and leg. Repeat __________ times. Complete this exercise __________ times per day.  STRENGTHENING - Abdominals and Quadriceps, Straight Leg Raise   Lie on a firm bed or floor with both legs extended in front of you.  Keeping one leg in contact with the floor, bend the other knee so that your foot can rest flat on the floor.  Find your neutral spine, and tense your abdominal muscles to maintain your spinal position throughout the exercise.  Slowly lift your straight leg off the floor about 6 inches for a count of 15, making sure to not hold your breath.  Still keeping your neutral spine, slowly lower your leg all the way to the floor. Repeat this exercise with each leg __________ times. Complete this exercise __________ times per day. POSTURE AND BODY MECHANICS CONSIDERATIONS - Low Back Sprain Keeping correct posture when sitting, standing or completing your activities will  reduce the stress put on different body tissues, allowing injured tissues a chance to heal and limiting painful experiences. The following are general guidelines for improved posture. Your physician or physical therapist will provide you with any instructions specific to your needs. While reading these guidelines, remember:  The exercises prescribed by your provider will help you have the flexibility and strength to maintain correct postures.  The correct posture provides the best environment for your joints to work. All of your joints have less wear and tear when properly supported by a spine with good posture. This means you will experience a healthier, less painful body.  Correct posture must be practiced with all of your activities, especially prolonged sitting and standing. Correct posture is as important when doing repetitive low-stress activities (typing) as it is when doing a single heavy-load activity (lifting). RESTING POSITIONS Consider which positions are most painful for you when choosing a resting position. If you have pain with flexion-based activities (sitting, bending, stooping, squatting), choose a position that allows you to rest in a less flexed posture. You would want to avoid curling into a fetal position on your side. If your pain worsens with extension-based activities (prolonged standing, working overhead), avoid resting in an extended position such as sleeping on your stomach. Most people will find more comfort when they rest with their spine in a more neutral position, neither too rounded nor too arched. Lying on a non-sagging bed on your side with a pillow between your knees, or on your back with a pillow under your knees will often provide some relief. Keep in mind, being in any one position for a prolonged period of time, no matter how correct your posture, can still lead to stiffness. PROPER SITTING POSTURE In order to minimize stress and discomfort on your spine, you must  sit with correct posture. Sitting with good posture should be effortless for a healthy body. Returning to good posture is a gradual process. Many people can work toward this most comfortably by using various supports until they have the flexibility and strength to maintain this posture on their own. When sitting with proper posture, your ears will fall over your shoulders and your shoulders will fall over your hips. You should use the back of  the chair to support your upper back. Your lower back will be in a neutral position, just slightly arched. You may place a small pillow or folded towel at the base of your lower back for  support.  When working at a desk, create an environment that supports good, upright posture. Without extra support, muscles tire, which leads to excessive strain on joints and other tissues. Keep these recommendations in mind: CHAIR:  A chair should be able to slide under your desk when your back makes contact with the back of the chair. This allows you to work closely.  The chair's height should allow your eyes to be level with the upper part of your monitor and your hands to be slightly lower than your elbows. BODY POSITION  Your feet should make contact with the floor. If this is not possible, use a foot rest.  Keep your ears over your shoulders. This will reduce stress on your neck and low back. INCORRECT SITTING POSTURES  If you are feeling tired and unable to assume a healthy sitting posture, do not slouch or slump. This puts excessive strain on your back tissues, causing more damage and pain. Healthier options include:  Using more support, like a lumbar pillow.  Switching tasks to something that requires you to be upright or walking.  Talking a brief walk.  Lying down to rest in a neutral-spine position. PROLONGED STANDING WHILE SLIGHTLY LEANING FORWARD  When completing a task that requires you to lean forward while standing in one place for a long time, place  either foot up on a stationary 2-4 inch high object to help maintain the best posture. When both feet are on the ground, the lower back tends to lose its slight inward curve. If this curve flattens (or becomes too large), then the back and your other joints will experience too much stress, tire more quickly, and can cause pain. CORRECT STANDING POSTURES Proper standing posture should be assumed with all daily activities, even if they only take a few moments, like when brushing your teeth. As in sitting, your ears should fall over your shoulders and your shoulders should fall over your hips. You should keep a slight tension in your abdominal muscles to brace your spine. Your tailbone should point down to the ground, not behind your body, resulting in an over-extended swayback posture.  INCORRECT STANDING POSTURES  Common incorrect standing postures include a forward head, locked knees and/or an excessive swayback. WALKING Walk with an upright posture. Your ears, shoulders and hips should all line-up. PROLONGED ACTIVITY IN A FLEXED POSITION When completing a task that requires you to bend forward at your waist or lean over a low surface, try to find a way to stabilize 3 out of 4 of your limbs. You can place a hand or elbow on your thigh or rest a knee on the surface you are reaching across. This will provide you more stability, so that your muscles do not tire as quickly. By keeping your knees relaxed, or slightly bent, you will also reduce stress across your lower back. CORRECT LIFTING TECHNIQUES DO :  Assume a wide stance. This will provide you more stability and the opportunity to get as close as possible to the object which you are lifting.  Tense your abdominals to brace your spine. Bend at the knees and hips. Keeping your back locked in a neutral-spine position, lift using your leg muscles. Lift with your legs, keeping your back straight.  Test the weight of  unknown objects before attempting to  lift them.  Try to keep your elbows locked down at your sides in order get the best strength from your shoulders when carrying an object.  Always ask for help when lifting heavy or awkward objects. INCORRECT LIFTING TECHNIQUES DO NOT:   Lock your knees when lifting, even if it is a small object.  Bend and twist. Pivot at your feet or move your feet when needing to change directions.  Assume that you can safely pick up even a paperclip without proper posture.   This information is not intended to replace advice given to you by your health care provider. Make sure you discuss any questions you have with your health care provider.   Document Released: 04/19/2005 Document Revised: 05/10/2014 Document Reviewed: 08/01/2008 [employer] Interactive Patient Education [employer]

## 2015-02-18 NOTE — Progress Notes (Signed)
Pre visit review using our clinic review tool, if applicable. No additional management support is needed unless otherwise documented below in the visit note.  Refused flu shot 

## 2015-02-18 NOTE — Progress Notes (Signed)
Subjective:    Patient ID: Sharon Kane, female    DOB: 1961/02/24, 54 y.o.   MRN: [id number]  Chief Complaint  Patient presents with  . Leg Pain    x2 weeks, has been having shooting pains going through her legs and says it feels like she gets cramps, she thinks it could be her lower back causing it, also wants basic blood work    HPI:  Dorthey Depace is a 54 y.o. female who  has a past medical history of Diabetes mellitus without complication ([place]). and presents today for a follow up office visit.   1.) Leg pain - Associated symptom of pain located in her legs and described as shooting and cramps has been going on for about 2 weeks and has concern that it is her lower back causing the symptoms. The pain starts in her lower back. The severity of the symptoms has increased as well as the frequency of the shooting pains. Modifying factors include massage, Advil/Aleve which did provide some relief for sleep but not much during the day. She tries to sit as much as she can during her work day. Experienced a fall about 2 weeks ago, however the pain was there prior to the fall. Denies any other trauma or sounds/sensations heard or felt. Denies saddle anesthesia.   No Known Allergies   Current Outpatient Prescriptions on File Prior to Visit  Medication Sig Dispense Refill  . Albuterol Sulfate (PROAIR RESPICLICK) 366 (90 BASE) MCG/ACT AEPB Inhale 2 puffs into the lungs 4 (four) times daily as needed. 1 each 11  . atorvastatin (LIPITOR) 20 MG tablet Take 1 tablet (20 mg total) by mouth daily. 90 tablet 3  . Blood Glucose Monitoring Suppl (ONETOUCH VERIO IQ SYSTEM) W/DEVICE KIT 1 Act by Does not apply route 2 (two) times daily. 2 kit 0  . empagliflozin (JARDIANCE) 25 MG TABS tablet Take 25 mg by mouth daily. 90 tablet 3  . esomeprazole (NEXIUM) 40 MG capsule Frequency:   Dosage:0   MG  Instructions:  Note:TAKE 1 CAPSULE TWICE DAILY x 2 WEEKS: THEN 1 CAPSULE DAILY THEREAFTER    . glucose  blood (ONETOUCH VERIO) test strip Dx: E11.8 Test up to BID 200 each 5  . Insulin Glargine (TOUJEO SOLOSTAR) 300 UNIT/ML SOPN Inject 30 Units into the skin daily. 1.5 mL 5  . Lancets (ONETOUCH ULTRASOFT) lancets 1 each by Other route 2 (two) times daily. Use to help check blood sugars twice a day Dx E11.8 180 each 3  . naproxen (NAPROSYN) 500 MG tablet Take 1 tablet (500 mg total) by mouth 2 (two) times daily with a meal. 60 tablet 0  . SitaGLIPtin-MetFORMIN HCl 50-1000 MG TB24 Take 1 tablet by mouth 2 (two) times daily. 180 tablet 1   No current facility-administered medications on file prior to visit.    Past Medical History  Diagnosis Date  . Diabetes mellitus without complication ([place])     Review of Systems  Constitutional: Negative for fever and chills.  Musculoskeletal: Positive for back pain.  Neurological: Positive for numbness. Negative for weakness.      Objective:    BP 132/90 mmHg  Pulse 78  Temp(Src) 98.2 F (36.8 C) (Oral)  Resp 18  Ht _0  (1.549 m)  Wt 144 lb 1.9 oz (65.372 kg)  BMI 27.25 kg/m2  SpO2 97%  LMP 05/03/2000 Nursing note and vital signs reviewed.  Physical Exam  Constitutional: She is oriented to person, place, and time. She appears  well-developed and well-nourished. No distress.  Cardiovascular: Normal rate, regular rhythm, normal heart sounds and intact distal pulses.   Pulmonary/Chest: Effort normal and breath sounds normal.  Musculoskeletal:  No obvious deformity, discoloration, or edema of lumbar spine noted. Range of motion is within normal limits however discomfort is noted with extension, lateral bending, and rotation. Straight leg raise is negative. Distal pulses, sensation, and reflexes are intact and appropriate.  Neurological: She is alert and oriented to person, place, and time.  Skin: Skin is warm and dry.  Psychiatric: She has a normal mood and affect. Her behavior is normal. Judgment and thought content normal.         Assessment & Plan:   Problem List Items Addressed This Visit      Nervous and Auditory   Sciatica - Primary    Symptoms and exam consistent with sciatica possibly related to disc pathology. Obtain lumbar x-rays. Start prednisone taper. Instructions on blood sugar management provided if needed. Start heat and home exercise therapy. Follow up pending x-ray results or worsening of symptoms.       Relevant Medications   predniSONE (STERAPRED UNI-PAK 21 TAB) 10 MG (21) TBPK tablet   Other Relevant Orders   DG Lumbar Spine Complete (Completed)

## 2015-02-20 ENCOUNTER — Encounter: Payer: Self-pay | Admitting: Family

## 2015-04-01 ENCOUNTER — Other Ambulatory Visit: Payer: Self-pay

## 2015-04-01 DIAGNOSIS — E118 Type 2 diabetes mellitus with unspecified complications: Secondary | ICD-10-CM

## 2015-04-02 ENCOUNTER — Other Ambulatory Visit: Payer: Self-pay

## 2015-04-02 DIAGNOSIS — E118 Type 2 diabetes mellitus with unspecified complications: Secondary | ICD-10-CM

## 2015-04-02 MED ORDER — SITAGLIP PHOS-METFORMIN HCL ER 50-1000 MG PO TB24: 1.0000 | ORAL_TABLET | Freq: Two times a day (BID) | ORAL | Status: DC

## 2015-04-29 ENCOUNTER — Other Ambulatory Visit: Payer: Self-pay | Admitting: Internal Medicine

## 2015-05-20 ENCOUNTER — Encounter: Payer: Self-pay | Admitting: Internal Medicine

## 2015-05-20 ENCOUNTER — Other Ambulatory Visit (INDEPENDENT_AMBULATORY_CARE_PROVIDER_SITE_OTHER): Payer: BLUE CROSS/BLUE SHIELD

## 2015-05-20 ENCOUNTER — Ambulatory Visit (INDEPENDENT_AMBULATORY_CARE_PROVIDER_SITE_OTHER): Payer: BLUE CROSS/BLUE SHIELD | Admitting: Internal Medicine

## 2015-05-20 VITALS — BP 122/80 | HR 78 | Temp 98.2°F | Resp 16 | Ht 61.0 in | Wt 144.0 lb

## 2015-05-20 DIAGNOSIS — E785 Hyperlipidemia, unspecified: Secondary | ICD-10-CM

## 2015-05-20 DIAGNOSIS — E118 Type 2 diabetes mellitus with unspecified complications: Secondary | ICD-10-CM

## 2015-05-20 DIAGNOSIS — M5416 Radiculopathy, lumbar region: Secondary | ICD-10-CM

## 2015-05-20 DIAGNOSIS — Z1211 Encounter for screening for malignant neoplasm of colon: Secondary | ICD-10-CM

## 2015-05-20 DIAGNOSIS — Z1231 Encounter for screening mammogram for malignant neoplasm of breast: Secondary | ICD-10-CM

## 2015-05-20 DIAGNOSIS — Z0000 Encounter for general adult medical examination without abnormal findings: Secondary | ICD-10-CM

## 2015-05-20 DIAGNOSIS — Z794 Long term (current) use of insulin: Secondary | ICD-10-CM

## 2015-05-20 LAB — CBC WITH DIFFERENTIAL/PLATELET
BASOS ABS: 0.1 10*3/uL (ref 0.0–0.1)
Basophils Relative: 0.7 % (ref 0.0–3.0)
Eosinophils Absolute: 0.1 10*3/uL (ref 0.0–0.7)
Eosinophils Relative: 1 % (ref 0.0–5.0)
HCT: 44.2 % (ref 36.0–46.0)
Hemoglobin: 14.5 g/dL (ref 12.0–15.0)
LYMPHS ABS: 2.8 10*3/uL (ref 0.7–4.0)
Lymphocytes Relative: 32.1 % (ref 12.0–46.0)
MCHC: 32.9 g/dL (ref 30.0–36.0)
MCV: 85.3 fl (ref 78.0–100.0)
MONO ABS: 0.4 10*3/uL (ref 0.1–1.0)
Monocytes Relative: 4.6 % (ref 3.0–12.0)
NEUTROS PCT: 61.6 % (ref 43.0–77.0)
Neutro Abs: 5.3 10*3/uL (ref 1.4–7.7)
PLATELETS: 201 10*3/uL (ref 150.0–400.0)
RBC: 5.18 Mil/uL — AB (ref 3.87–5.11)
RDW: 14.1 % (ref 11.5–15.5)
WBC: 8.6 10*3/uL (ref 4.0–10.5)

## 2015-05-20 LAB — URINALYSIS, ROUTINE W REFLEX MICROSCOPIC
Bilirubin Urine: NEGATIVE
Hgb urine dipstick: NEGATIVE
Ketones, ur: NEGATIVE
Leukocytes, UA: NEGATIVE
Nitrite: NEGATIVE
RBC / HPF: NONE SEEN
Specific Gravity, Urine: 1.02
Total Protein, Urine: NEGATIVE
Urine Glucose: >=1000 — AB
Urobilinogen, UA: 0.2
pH: 5.5 (ref 5.0–8.0)

## 2015-05-20 LAB — LIPID PANEL
Cholesterol: 198 mg/dL (ref 0–200)
HDL: 46.2 mg/dL
LDL Cholesterol: 125 mg/dL — ABNORMAL HIGH (ref 0–99)
NonHDL: 152.28
Total CHOL/HDL Ratio: 4
Triglycerides: 135 mg/dL (ref 0.0–149.0)
VLDL: 27 mg/dL (ref 0.0–40.0)

## 2015-05-20 LAB — MICROALBUMIN / CREATININE URINE RATIO
CREATININE, U: 46 mg/dL
Microalb Creat Ratio: 1.5 mg/g (ref 0.0–30.0)

## 2015-05-20 LAB — COMPREHENSIVE METABOLIC PANEL
ALK PHOS: 62 U/L (ref 39–117)
ALT: 15 U/L (ref 0–35)
AST: 14 U/L (ref 0–37)
Albumin: 4 g/dL (ref 3.5–5.2)
BILIRUBIN TOTAL: 0.6 mg/dL (ref 0.2–1.2)
BUN: 14 mg/dL (ref 6–23)
CO2: 29 mEq/L (ref 19–32)
Calcium: 9.3 mg/dL (ref 8.4–10.5)
Chloride: 104 mEq/L (ref 96–112)
Creatinine, Ser: 0.62 mg/dL (ref 0.40–1.20)
GFR: 106.15 mL/min (ref >60.00)
Glucose, Bld: 158 mg/dL — ABNORMAL HIGH (ref 70–99)
Potassium: 4.1 mEq/L (ref 3.5–5.1)
SODIUM: 140 meq/L (ref 135–145)
TOTAL PROTEIN: 7.6 g/dL (ref 6.0–8.3)

## 2015-05-20 LAB — TSH: TSH: 1.18 u[IU]/mL (ref 0.35–4.50)

## 2015-05-20 LAB — HEMOGLOBIN A1C: Hgb A1c MFr Bld: 8.5 % — ABNORMAL HIGH (ref 4.6–6.5)

## 2015-05-20 MED ORDER — ASPIRIN EC 81 MG PO TBEC: 81.0000 mg | DELAYED_RELEASE_TABLET | Freq: Every day | ORAL | Status: DC

## 2015-05-20 MED ORDER — INSULIN GLARGINE 100 UNIT/ML SOLOSTAR PEN
30.0000 [IU] | PEN_INJECTOR | Freq: Every day | SUBCUTANEOUS | Status: DC
Start: 2015-05-20 — End: 2016-09-22

## 2015-05-20 MED ORDER — SITAGLIP PHOS-METFORMIN HCL ER 50-1000 MG PO TB24
1.0000 | ORAL_TABLET | Freq: Two times a day (BID) | ORAL | Status: DC
Start: 2015-05-20 — End: 2016-09-22

## 2015-05-20 NOTE — Patient Instructions (Signed)
Preventive Care for Adults, Female A healthy lifestyle and preventive care can promote health and wellness. Preventive health guidelines for women include the following key practices.  A routine yearly physical is a good way to check with your health care provider about your health and preventive screening. It is a chance to share any concerns and updates on your health and to receive a thorough exam.  Visit your dentist for a routine exam and preventive care every 6 months. Brush your teeth twice a day and floss once a day. Good oral hygiene prevents tooth decay and gum disease.  The frequency of eye exams is based on your age, health, family medical history, use of contact lenses, and other factors. Follow your health care provider's recommendations for frequency of eye exams.  Eat a healthy diet. Foods like vegetables, fruits, whole grains, low-fat dairy products, and lean protein foods contain the nutrients you need without too many calories. Decrease your intake of foods high in solid fats, added sugars, and salt. Eat the right amount of calories for you.Get information about a proper diet from your health care provider, if necessary.  Regular physical exercise is one of the most important things you can do for your health. Most adults should get at least 150 minutes of moderate-intensity exercise (any activity that increases your heart rate and causes you to sweat) each week. In addition, most adults need muscle-strengthening exercises on 2 or more days a week.  Maintain a healthy weight. The body mass index (BMI) is a screening tool to identify possible weight problems. It provides an estimate of body fat based on height and weight. Your health care provider can find your BMI and can help you achieve or maintain a healthy weight.For adults 20 years and older:  A BMI below 18.5 is considered underweight.  A BMI of 18.5 to 24.9 is normal.  A BMI of 25 to 29.9 is considered overweight.  A  BMI of 30 and above is considered obese.  Maintain normal blood lipids and cholesterol levels by exercising and minimizing your intake of saturated fat. Eat a balanced diet with plenty of fruit and vegetables. Blood tests for lipids and cholesterol should begin at age 45 and be repeated every 5 years. If your lipid or cholesterol levels are high, you are over 50, or you are at high risk for heart disease, you may need your cholesterol levels checked more frequently.Ongoing high lipid and cholesterol levels should be treated with medicines if diet and exercise are not working.  If you smoke, find out from your health care provider how to quit. If you do not use tobacco, do not start.  Lung cancer screening is recommended for adults aged 45-80 years who are at high risk for developing lung cancer because of a history of smoking. A yearly low-dose CT scan of the lungs is recommended for people who have at least a 30-pack-year history of smoking and are a current smoker or have quit within the past 15 years. A pack year of smoking is smoking an average of 1 pack of cigarettes a day for 1 year (for example: 1 pack a day for 30 years or 2 packs a day for 15 years). Yearly screening should continue until the smoker has stopped smoking for at least 15 years. Yearly screening should be stopped for people who develop a health problem that would prevent them from having lung cancer treatment.  If you are pregnant, do not drink alcohol. If you are  breastfeeding, be very cautious about drinking alcohol. If you are not pregnant and choose to drink alcohol, do not have more than 1 drink per day. One drink is considered to be 12 ounces (355 mL) of beer, 5 ounces (148 mL) of wine, or 1.5 ounces (44 mL) of liquor.  Avoid use of street drugs. Do not share needles with anyone. Ask for help if you need support or instructions about stopping the use of drugs.  High blood pressure causes heart disease and increases the risk  of stroke. Your blood pressure should be checked at least every 1 to 2 years. Ongoing high blood pressure should be treated with medicines if weight loss and exercise do not work.  If you are 53-92 years old, ask your health care provider if you should take aspirin to prevent strokes.  Diabetes screening is done by taking a blood sample to check your blood glucose level after you have not eaten for a certain period of time (fasting). If you are not overweight and you do not have risk factors for diabetes, you should be screened once every 3 years starting at age 10. If you are overweight or obese and you are 45-51 years of age, you should be screened for diabetes every year as part of your cardiovascular risk assessment.  Breast cancer screening is essential preventive care for women. You should practice "breast self-awareness." This means understanding the normal appearance and feel of your breasts and may include breast self-examination. Any changes detected, no matter how small, should be reported to a health care provider. Women in their 69s and 30s should have a clinical breast exam (CBE) by a health care provider as part of a regular health exam every 1 to 3 years. After age 60, women should have a CBE every year. Starting at age 42, women should consider having a mammogram (breast X-ray test) every year. Women who have a family history of breast cancer should talk to their health care provider about genetic screening. Women at a high risk of breast cancer should talk to their health care providers about having an MRI and a mammogram every year.  Breast cancer gene (BRCA)-related cancer risk assessment is recommended for women who have family members with BRCA-related cancers. BRCA-related cancers include breast, ovarian, tubal, and peritoneal cancers. Having family members with these cancers may be associated with an increased risk for harmful changes (mutations) in the breast cancer genes BRCA1 and  BRCA2. Results of the assessment will determine the need for genetic counseling and BRCA1 and BRCA2 testing.  Your health care provider may recommend that you be screened regularly for cancer of the pelvic organs (ovaries, uterus, and vagina). This screening involves a pelvic examination, including checking for microscopic changes to the surface of your cervix (Pap test). You may be encouraged to have this screening done every 3 years, beginning at age 6.  For women ages 80-65, health care providers may recommend pelvic exams and Pap testing every 3 years, or they may recommend the Pap and pelvic exam, combined with testing for human papilloma virus (HPV), every 5 years. Some types of HPV increase your risk of cervical cancer. Testing for HPV may also be done on women of any age with unclear Pap test results.  Other health care providers may not recommend any screening for nonpregnant women who are considered low risk for pelvic cancer and who do not have symptoms. Ask your health care provider if a screening pelvic exam is right for  you.  If you have had past treatment for cervical cancer or a condition that could lead to cancer, you need Pap tests and screening for cancer for at least 20 years after your treatment. If Pap tests have been discontinued, your risk factors (such as having a new sexual partner) need to be reassessed to determine if screening should resume. Some women have medical problems that increase the chance of getting cervical cancer. In these cases, your health care provider may recommend more frequent screening and Pap tests.  Colorectal cancer can be detected and often prevented. Most routine colorectal cancer screening begins at the age of 50 years and continues through age 75 years. However, your health care provider may recommend screening at an earlier age if you have risk factors for colon cancer. On a yearly basis, your health care provider may provide home test kits to check  for hidden blood in the stool. Use of a small camera at the end of a tube, to directly examine the colon (sigmoidoscopy or colonoscopy), can detect the earliest forms of colorectal cancer. Talk to your health care provider about this at age 50, when routine screening begins. Direct exam of the colon should be repeated every 5-10 years through age 75 years, unless early forms of precancerous polyps or small growths are found.  People who are at an increased risk for hepatitis B should be screened for this virus. You are considered at high risk for hepatitis B if:  You were born in a country where hepatitis B occurs often. Talk with your health care provider about which countries are considered high risk.  Your parents were born in a high-risk country and you have not received a shot to protect against hepatitis B (hepatitis B vaccine).  You have HIV or AIDS.  You use needles to inject street drugs.  You live with, or have sex with, someone who has hepatitis B.  You get hemodialysis treatment.  You take certain medicines for conditions like cancer, organ transplantation, and autoimmune conditions.  Hepatitis C blood testing is recommended for all people born from 1945 through 1965 and any individual with known risks for hepatitis C.  Practice safe sex. Use condoms and avoid high-risk sexual practices to reduce the spread of sexually transmitted infections (STIs). STIs include gonorrhea, chlamydia, syphilis, trichomonas, herpes, HPV, and human immunodeficiency virus (HIV). Herpes, HIV, and HPV are viral illnesses that have no cure. They can result in disability, cancer, and death.  You should be screened for sexually transmitted illnesses (STIs) including gonorrhea and chlamydia if:  You are sexually active and are younger than 24 years.  You are older than 24 years and your health care provider tells you that you are at risk for this type of infection.  Your sexual activity has changed  since you were last screened and you are at an increased risk for chlamydia or gonorrhea. Ask your health care provider if you are at risk.  If you are at risk of being infected with HIV, it is recommended that you take a prescription medicine daily to prevent HIV infection. This is called preexposure prophylaxis (PrEP). You are considered at risk if:  You are sexually active and do not regularly use condoms or know the HIV status of your partner(s).  You take drugs by injection.  You are sexually active with a partner who has HIV.  Talk with your health care provider about whether you are at high risk of being infected with HIV. If   you choose to begin PrEP, you should first be tested for HIV. You should then be tested every 3 months for as long as you are taking PrEP.  Osteoporosis is a disease in which the bones lose minerals and strength with aging. This can result in serious bone fractures or breaks. The risk of osteoporosis can be identified using a bone density scan. Women ages 32 years and over and women at risk for fractures or osteoporosis should discuss screening with their health care providers. Ask your health care provider whether you should take a calcium supplement or vitamin D to reduce the rate of osteoporosis.  Menopause can be associated with physical symptoms and risks. Hormone replacement therapy is available to decrease symptoms and risks. You should talk to your health care provider about whether hormone replacement therapy is right for you.  Use sunscreen. Apply sunscreen liberally and repeatedly throughout the day. You should seek shade when your shadow is shorter than you. Protect yourself by wearing long sleeves, pants, a wide-brimmed hat, and sunglasses year round, whenever you are outdoors.  Once a month, do a whole body skin exam, using a mirror to look at the skin on your back. Tell your health care provider of new moles, moles that have irregular borders, moles that  are larger than a pencil eraser, or moles that have changed in shape or color.  Stay current with required vaccines (immunizations).  Influenza vaccine. All adults should be immunized every year.  Tetanus, diphtheria, and acellular pertussis (Td, Tdap) vaccine. Pregnant women should receive 1 dose of Tdap vaccine during each pregnancy. The dose should be obtained regardless of the length of time since the last dose. Immunization is preferred during the 27th-36th week of gestation. An adult who has not previously received Tdap or who does not know her vaccine status should receive 1 dose of Tdap. This initial dose should be followed by tetanus and diphtheria toxoids (Td) booster doses every 10 years. Adults with an unknown or incomplete history of completing a 3-dose immunization series with Td-containing vaccines should begin or complete a primary immunization series including a Tdap dose. Adults should receive a Td booster every 10 years.  Varicella vaccine. An adult without evidence of immunity to varicella should receive 2 doses or a second dose if she has previously received 1 dose. Pregnant females who do not have evidence of immunity should receive the first dose after pregnancy. This first dose should be obtained before leaving the health care facility. The second dose should be obtained 4-8 weeks after the first dose.  Human papillomavirus (HPV) vaccine. Females aged 13-26 years who have not received the vaccine previously should obtain the 3-dose series. The vaccine is not recommended for use in pregnant females. However, pregnancy testing is not needed before receiving a dose. If a female is found to be pregnant after receiving a dose, no treatment is needed. In that case, the remaining doses should be delayed until after the pregnancy. Immunization is recommended for any person with an immunocompromised condition through the age of 7 years if she did not get any or all doses earlier. During the  3-dose series, the second dose should be obtained 4-8 weeks after the first dose. The third dose should be obtained 24 weeks after the first dose and 16 weeks after the second dose.  Zoster vaccine. One dose is recommended for adults aged 17 years or older unless certain conditions are present.  Measles, mumps, and rubella (MMR) vaccine. Adults born  before 1957 generally are considered immune to measles and mumps. Adults born in 1957 or later should have 1 or more doses of MMR vaccine unless there is a contraindication to the vaccine or there is laboratory evidence of immunity to each of the three diseases. A routine second dose of MMR vaccine should be obtained at least 28 days after the first dose for students attending postsecondary schools, health care workers, or international travelers. People who received inactivated measles vaccine or an unknown type of measles vaccine during 1963-1967 should receive 2 doses of MMR vaccine. People who received inactivated mumps vaccine or an unknown type of mumps vaccine before 1979 and are at high risk for mumps infection should consider immunization with 2 doses of MMR vaccine. For females of childbearing age, rubella immunity should be determined. If there is no evidence of immunity, females who are not pregnant should be vaccinated. If there is no evidence of immunity, females who are pregnant should delay immunization until after pregnancy. Unvaccinated health care workers born before 1957 who lack laboratory evidence of measles, mumps, or rubella immunity or laboratory confirmation of disease should consider measles and mumps immunization with 2 doses of MMR vaccine or rubella immunization with 1 dose of MMR vaccine.  Pneumococcal 13-valent conjugate (PCV13) vaccine. When indicated, a person who is uncertain of his immunization history and has no record of immunization should receive the PCV13 vaccine. All adults 65 years of age and older should receive this  vaccine. An adult aged 19 years or older who has certain medical conditions and has not been previously immunized should receive 1 dose of PCV13 vaccine. This PCV13 should be followed with a dose of pneumococcal polysaccharide (PPSV23) vaccine. Adults who are at high risk for pneumococcal disease should obtain the PPSV23 vaccine at least 8 weeks after the dose of PCV13 vaccine. Adults older than 56 years of age who have normal immune system function should obtain the PPSV23 vaccine dose at least 1 year after the dose of PCV13 vaccine.  Pneumococcal polysaccharide (PPSV23) vaccine. When PCV13 is also indicated, PCV13 should be obtained first. All adults aged 65 years and older should be immunized. An adult younger than age 65 years who has certain medical conditions should be immunized. Any person who resides in a nursing home or long-term care facility should be immunized. An adult smoker should be immunized. People with an immunocompromised condition and certain other conditions should receive both PCV13 and PPSV23 vaccines. People with human immunodeficiency virus (HIV) infection should be immunized as soon as possible after diagnosis. Immunization during chemotherapy or radiation therapy should be avoided. Routine use of PPSV23 vaccine is not recommended for American Indians, [place] Natives, or people younger than 65 years unless there are medical conditions that require PPSV23 vaccine. When indicated, people who have unknown immunization and have no record of immunization should receive PPSV23 vaccine. One-time revaccination 5 years after the first dose of PPSV23 is recommended for people aged 19-64 years who have chronic kidney failure, nephrotic syndrome, asplenia, or immunocompromised conditions. People who received 1-2 doses of PPSV23 before age 65 years should receive another dose of PPSV23 vaccine at age 65 years or later if at least 5 years have passed since the previous dose. Doses of PPSV23 are not  needed for people immunized with PPSV23 at or after age 65 years.  Meningococcal vaccine. Adults with asplenia or persistent complement component deficiencies should receive 2 doses of quadrivalent meningococcal conjugate (MenACWY-D) vaccine. The doses should be obtained   at least 2 months apart. Microbiologists working with certain meningococcal bacteria, [place] recruits, people at risk during an outbreak, and people who travel to or live in countries with a high rate of meningitis should be immunized. A first-year college student up through age 34 years who is living in a residence hall should receive a dose if she did not receive a dose on or after her 16th birthday. Adults who have certain high-risk conditions should receive one or more doses of vaccine.  Hepatitis A vaccine. Adults who wish to be protected from this disease, have certain high-risk conditions, work with hepatitis A-infected animals, work in hepatitis A research labs, or travel to or work in countries with a high rate of hepatitis A should be immunized. Adults who were previously unvaccinated and who anticipate close contact with an international adoptee during the first 60 days after arrival in the [place] from a country with a high rate of hepatitis A should be immunized.  Hepatitis B vaccine. Adults who wish to be protected from this disease, have certain high-risk conditions, may be exposed to blood or other infectious body fluids, are household contacts or sex partners of hepatitis B positive people, are clients or workers in certain care facilities, or travel to or work in countries with a high rate of hepatitis B should be immunized.  Haemophilus influenzae type b (Hib) vaccine. A previously unvaccinated person with asplenia or sickle cell disease or having a scheduled splenectomy should receive 1 dose of Hib vaccine. Regardless of previous immunization, a recipient of a hematopoietic stem cell transplant should receive a  3-dose series 6-12 months after her successful transplant. Hib vaccine is not recommended for adults with HIV infection. Preventive Services / Frequency Ages 35 to 4 years  Blood pressure check.** / Every 3-5 years.  Lipid and cholesterol check.** / Every 5 years beginning at age 60.  Clinical breast exam.** / Every 3 years for women in their 71s and 10s.  BRCA-related cancer risk assessment.** / For women who have family members with a BRCA-related cancer (breast, ovarian, tubal, or peritoneal cancers).  Pap test.** / Every 2 years from ages 76 through 26. Every 3 years starting at age 61 through age 76 or 93 with a history of 3 consecutive normal Pap tests.  HPV screening.** / Every 3 years from ages 37 through ages 60 to 51 with a history of 3 consecutive normal Pap tests.  Hepatitis C blood test.** / For any individual with known risks for hepatitis C.  Skin self-exam. / Monthly.  Influenza vaccine. / Every year.  Tetanus, diphtheria, and acellular pertussis (Tdap, Td) vaccine.** / Consult your health care provider. Pregnant women should receive 1 dose of Tdap vaccine during each pregnancy. 1 dose of Td every 10 years.  Varicella vaccine.** / Consult your health care provider. Pregnant females who do not have evidence of immunity should receive the first dose after pregnancy.  HPV vaccine. / 3 doses over 6 months, if 93 and younger. The vaccine is not recommended for use in pregnant females. However, pregnancy testing is not needed before receiving a dose.  Measles, mumps, rubella (MMR) vaccine.** / You need at least 1 dose of MMR if you were born in 1957 or later. You may also need a 2nd dose. For females of childbearing age, rubella immunity should be determined. If there is no evidence of immunity, females who are not pregnant should be vaccinated. If there is no evidence of immunity, females who are  pregnant should delay immunization until after pregnancy.  Pneumococcal  13-valent conjugate (PCV13) vaccine.** / Consult your health care provider.  Pneumococcal polysaccharide (PPSV23) vaccine.** / 1 to 2 doses if you smoke cigarettes or if you have certain conditions.  Meningococcal vaccine.** / 1 dose if you are age 68 to 8 years and a Market researcher living in a residence hall, or have one of several medical conditions, you need to get vaccinated against meningococcal disease. You may also need additional booster doses.  Hepatitis A vaccine.** / Consult your health care provider.  Hepatitis B vaccine.** / Consult your health care provider.  Haemophilus influenzae type b (Hib) vaccine.** / Consult your health care provider. Ages 7 to 53 years  Blood pressure check.** / Every year.  Lipid and cholesterol check.** / Every 5 years beginning at age 25 years.  Lung cancer screening. / Every year if you are aged 11-80 years and have a 30-pack-year history of smoking and currently smoke or have quit within the past 15 years. Yearly screening is stopped once you have quit smoking for at least 15 years or develop a health problem that would prevent you from having lung cancer treatment.  Clinical breast exam.** / Every year after age 48 years.  BRCA-related cancer risk assessment.** / For women who have family members with a BRCA-related cancer (breast, ovarian, tubal, or peritoneal cancers).  Mammogram.** / Every year beginning at age 41 years and continuing for as long as you are in good health. Consult with your health care provider.  Pap test.** / Every 3 years starting at age 65 years through age 37 or 70 years with a history of 3 consecutive normal Pap tests.  HPV screening.** / Every 3 years from ages 72 years through ages 60 to 40 years with a history of 3 consecutive normal Pap tests.  Fecal occult blood test (FOBT) of stool. / Every year beginning at age 21 years and continuing until age 5 years. You may not need to do this test if you get  a colonoscopy every 10 years.  Flexible sigmoidoscopy or colonoscopy.** / Every 5 years for a flexible sigmoidoscopy or every 10 years for a colonoscopy beginning at age 35 years and continuing until age 48 years.  Hepatitis C blood test.** / For all people born from [birth year] through 1965 and any individual with known risks for hepatitis C.  Skin self-exam. / Monthly.  Influenza vaccine. / Every year.  Tetanus, diphtheria, and acellular pertussis (Tdap/Td) vaccine.** / Consult your health care provider. Pregnant women should receive 1 dose of Tdap vaccine during each pregnancy. 1 dose of Td every 10 years.  Varicella vaccine.** / Consult your health care provider. Pregnant females who do not have evidence of immunity should receive the first dose after pregnancy.  Zoster vaccine.** / 1 dose for adults aged 30 years or older.  Measles, mumps, rubella (MMR) vaccine.** / You need at least 1 dose of MMR if you were born in 1957 or later. You may also need a second dose. For females of childbearing age, rubella immunity should be determined. If there is no evidence of immunity, females who are not pregnant should be vaccinated. If there is no evidence of immunity, females who are pregnant should delay immunization until after pregnancy.  Pneumococcal 13-valent conjugate (PCV13) vaccine.** / Consult your health care provider.  Pneumococcal polysaccharide (PPSV23) vaccine.** / 1 to 2 doses if you smoke cigarettes or if you have certain conditions.  Meningococcal vaccine.** /  Consult your health care provider.  Hepatitis A vaccine.** / Consult your health care provider.  Hepatitis B vaccine.** / Consult your health care provider.  Haemophilus influenzae type b (Hib) vaccine.** / Consult your health care provider. Ages 64 years and over  Blood pressure check.** / Every year.  Lipid and cholesterol check.** / Every 5 years beginning at age 23 years.  Lung cancer screening. / Every year if you  are aged 16-80 years and have a 30-pack-year history of smoking and currently smoke or have quit within the past 15 years. Yearly screening is stopped once you have quit smoking for at least 15 years or develop a health problem that would prevent you from having lung cancer treatment.  Clinical breast exam.** / Every year after age 74 years.  BRCA-related cancer risk assessment.** / For women who have family members with a BRCA-related cancer (breast, ovarian, tubal, or peritoneal cancers).  Mammogram.** / Every year beginning at age 44 years and continuing for as long as you are in good health. Consult with your health care provider.  Pap test.** / Every 3 years starting at age 58 years through age 22 or 39 years with 3 consecutive normal Pap tests. Testing can be stopped between 65 and 70 years with 3 consecutive normal Pap tests and no abnormal Pap or HPV tests in the past 10 years.  HPV screening.** / Every 3 years from ages 64 years through ages 70 or 61 years with a history of 3 consecutive normal Pap tests. Testing can be stopped between 65 and 70 years with 3 consecutive normal Pap tests and no abnormal Pap or HPV tests in the past 10 years.  Fecal occult blood test (FOBT) of stool. / Every year beginning at age 40 years and continuing until age 27 years. You may not need to do this test if you get a colonoscopy every 10 years.  Flexible sigmoidoscopy or colonoscopy.** / Every 5 years for a flexible sigmoidoscopy or every 10 years for a colonoscopy beginning at age 7 years and continuing until age 32 years.  Hepatitis C blood test.** / For all people born from [birth year] through 1966 and any individual with known risks for hepatitis C.  Osteoporosis screening.** / A one-time screening for women ages 30 years and over and women at risk for fractures or osteoporosis.  Skin self-exam. / Monthly.  Influenza vaccine. / Every year.  Tetanus, diphtheria, and acellular pertussis (Tdap/Td)  vaccine.** / 1 dose of Td every 10 years.  Varicella vaccine.** / Consult your health care provider.  Zoster vaccine.** / 1 dose for adults aged 35 years or older.  Pneumococcal 13-valent conjugate (PCV13) vaccine.** / Consult your health care provider.  Pneumococcal polysaccharide (PPSV23) vaccine.** / 1 dose for all adults aged 46 years and older.  Meningococcal vaccine.** / Consult your health care provider.  Hepatitis A vaccine.** / Consult your health care provider.  Hepatitis B vaccine.** / Consult your health care provider.  Haemophilus influenzae type b (Hib) vaccine.** / Consult your health care provider. ** Family history and personal history of risk and conditions may change your health care provider's recommendations.   This information is not intended to replace advice given to you by your health care provider. Make sure you discuss any questions you have with your health care provider.   Document Released: 06/15/2001 Document Revised: 05/10/2014 Document Reviewed: 09/14/2010 Elsevier Interactive Patient Education [employer].

## 2015-05-20 NOTE — Progress Notes (Signed)
Pre visit review using our clinic review tool, if applicable. No additional management support is needed unless otherwise documented below in the visit note. 

## 2015-05-20 NOTE — Progress Notes (Signed)
Subjective:  Patient ID: Sharon Kane, female    DOB: 1960/12/21  Age: 55 y.o. MRN: [id number]  CC: Diabetes; Annual Exam; and Back Pain   HPI Dylanie Quesenberry presents for a physical. She also complains of persistent episodes of low back pain that radiates into her left lower extremity. She denies any recent episodes of numbness, weakness, tingling.; On for several months and was previously treated with a course of steroids and naproxen. She's not had much improvement with that. She is not willing to consider pain management or surgery but she is willing to consider physical therapy to have an MRI done of her lower back. She also needs a follow-up on diabetes, she complains of polydipsia. She ran out of her insulin several months ago and has not been using it.  Outpatient Prescriptions Prior to Visit  Medication Sig Dispense Refill  . Albuterol Sulfate (PROAIR RESPICLICK) 741 (90 BASE) MCG/ACT AEPB Inhale 2 puffs into the lungs 4 (four) times daily as needed. 1 each 11  . atorvastatin (LIPITOR) 20 MG tablet Take 1 tablet (20 mg total) by mouth daily. 90 tablet 3  . Blood Glucose Monitoring Suppl (ONETOUCH VERIO IQ SYSTEM) W/DEVICE KIT 1 Act by Does not apply route 2 (two) times daily. 2 kit 0  . empagliflozin (JARDIANCE) 25 MG TABS tablet Take 25 mg by mouth daily. 90 tablet 3  . esomeprazole (NEXIUM) 40 MG capsule Frequency:   Dosage:0   MG  Instructions:  Note:TAKE 1 CAPSULE TWICE DAILY x 2 WEEKS: THEN 1 CAPSULE DAILY THEREAFTER    . glucose blood (ONETOUCH VERIO) test strip Dx: E11.8 Test up to BID 200 each 5  . Lancets (ONETOUCH ULTRASOFT) lancets 1 each by Other route 2 (two) times daily. Use to help check blood sugars twice a day Dx E11.8 180 each 3  . Insulin Glargine (TOUJEO SOLOSTAR) 300 UNIT/ML SOPN Inject 30 Units into the skin daily. 1.5 mL 5  . naproxen (NAPROSYN) 500 MG tablet Take 1 tablet (500 mg total) by mouth 2 (two) times daily with a meal. 60 tablet 0  . predniSONE  (STERAPRED UNI-PAK 21 TAB) 10 MG (21) TBPK tablet Take 6 tablets x 1 day, 5 tablets x 1 day, 4 tablets x 1 day, 3 tablets x 1 day, 2 tablets x 1 day, 1 tablet x 1 day 21 tablet 0  . SitaGLIPtin-MetFORMIN HCl 50-1000 MG TB24 Take 1 tablet by mouth 2 (two) times daily. 180 tablet 0   No facility-administered medications prior to visit.    ROS Review of Systems  Constitutional: Negative.  Negative for fever, chills, diaphoresis, appetite change and fatigue.  HENT: Negative.  Negative for sinus pressure, sore throat and trouble swallowing.   Eyes: Negative.   Respiratory: Negative.  Negative for cough, choking, chest tightness, shortness of breath and stridor.   Cardiovascular: Negative.  Negative for chest pain, palpitations and leg swelling.  Gastrointestinal: Negative.  Negative for nausea, vomiting, abdominal pain, diarrhea, constipation and blood in stool.  Endocrine: Positive for polyphagia. Negative for polydipsia and polyuria.  Genitourinary: Negative.  Negative for dysuria, urgency, hematuria, decreased urine volume and difficulty urinating.  Musculoskeletal: Positive for back pain. Negative for myalgias, joint swelling, arthralgias, gait problem and neck pain.  Skin: Negative.  Negative for color change and rash.  Allergic/Immunologic: Negative.   Neurological: Negative.  Negative for dizziness, tremors, speech difficulty, weakness and numbness.  Hematological: Negative.  Negative for adenopathy. Does not bruise/bleed easily.  Psychiatric/Behavioral: Negative.  Objective:  BP 122/80 mmHg  Pulse 78  Temp(Src) 98.2 F (36.8 C) (Oral)  Resp 16  Ht 5' 1"  (1.549 m)  Wt 144 lb (65.318 kg)  BMI 27.22 kg/m2  SpO2 98%  LMP 05/03/2000  BP Readings from Last 3 Encounters:  05/20/15 122/80  02/18/15 132/90  10/08/14 130/82    Wt Readings from Last 3 Encounters:  05/20/15 144 lb (65.318 kg)  02/18/15 144 lb 1.9 oz (65.372 kg)  10/08/14 140 lb 1.9 oz (63.558 kg)    Physical  Exam  Constitutional: She is oriented to person, place, and time. She appears well-developed and well-nourished. No distress.  HENT:  Mouth/Throat: Oropharynx is clear and moist. No oropharyngeal exudate.  Eyes: Conjunctivae are normal. Right eye exhibits no discharge. Left eye exhibits no discharge. No scleral icterus.  Neck: Normal range of motion. Neck supple. No JVD present. No tracheal deviation present. No thyromegaly present.  Cardiovascular: Normal rate, regular rhythm, normal heart sounds and intact distal pulses.  Exam reveals no gallop and no friction rub.   No murmur heard. Pulmonary/Chest: Effort normal and breath sounds normal. No stridor. No respiratory distress. She has no wheezes. She has no rales. She exhibits no tenderness.  Abdominal: Soft. Bowel sounds are normal. She exhibits no distension and no mass. There is no tenderness. There is no rebound and no guarding.  Musculoskeletal: Normal range of motion. She exhibits no edema or tenderness.  Lymphadenopathy:    She has no cervical adenopathy.  Neurological: She is alert and oriented to person, place, and time. She has normal strength. She displays no atrophy, no tremor and normal reflexes. No cranial nerve deficit or sensory deficit. She exhibits normal muscle tone. She displays a negative Romberg sign. She displays no seizure activity. Coordination and gait normal. She displays no Babinski's sign on the right side. She displays no Babinski's sign on the left side.  Reflex Scores:      Tricep reflexes are 0 on the right side and 0 on the left side.      Bicep reflexes are 0 on the right side and 0 on the left side.      Brachioradialis reflexes are 0 on the right side and 0 on the left side.      Patellar reflexes are 0 on the right side and 0 on the left side.      Achilles reflexes are 0 on the right side and 0 on the left side. Negative SLR in bilateral lower extremity  Skin: Skin is warm and dry. No rash noted. She is  not diaphoretic. No erythema. No pallor.  Vitals reviewed.   Lab Results  Component Value Date   WBC 8.6 05/20/2015   HGB 14.5 05/20/2015   HCT 44.2 05/20/2015   PLT 201.0 05/20/2015   GLUCOSE 158* 05/20/2015   CHOL 198 05/20/2015   TRIG 135.0 05/20/2015   HDL 46.20 05/20/2015   LDLDIRECT 111.5 01/18/2013   LDLCALC 125* 05/20/2015   ALT 15 05/20/2015   AST 14 05/20/2015   NA 140 05/20/2015   K 4.1 05/20/2015   CL 104 05/20/2015   CREATININE 0.62 05/20/2015   BUN 14 05/20/2015   CO2 29 05/20/2015   TSH 1.18 05/20/2015   HGBA1C 8.5* 05/20/2015   MICROALBUR <0.7 05/20/2015    Dg Lumbar Spine Complete  02/18/2015  CLINICAL DATA:  Left back pain, fell 10 days ago EXAM: LUMBAR SPINE - COMPLETE 4+ VIEW COMPARISON:  None. FINDINGS: The lumbar vertebrae are  in normal alignment. Intervertebral disc spaces appear normal. There is anterior spur formation at L2-3 and L3-4 levels. There is a well corticated defect within the inferior anterior endplate of X90. This probably represents a Schmorl's node, with mild compression less likely. If pain persists MRI may be helpful to differentiate. The SI joints are corticated. IMPRESSION: 1. Normal alignment with only mild anterior osteophyte formation at L2-3 and L3-4. 2. Probable Schmorl's node indenting the inferior anterior aspect of T12 with compression less likely. Consider MRI if pain persists. Electronically Signed   By: Ivar Drape M.D.   On: 02/18/2015 11:14    Assessment & Plan:   Stephanie was seen today for diabetes, annual exam and back pain.  Diagnoses and all orders for this visit:  Routine general medical examination at a health care facility- she refused a flu vaccine today, exam done, labs ordered and reviewed, she was referred for mammogram and a colonoscopy. Patient education material was given. -     Lipid panel; Future -     Comprehensive metabolic panel; Future -     CBC with Differential/Platelet; Future -     TSH;  Future -     Urinalysis, Routine w reflex microscopic (not at [facility]); Future -     Hepatitis C antibody; Future -     HIV antibody; Future  Screen for colon cancer -     Ambulatory referral to Gastroenterology  Visit for screening mammogram -     MM DIGITAL SCREENING BILATERAL; Future  Type 2 diabetes mellitus with complication, without long-term current use of insulin ([facility])- her blood sugars are not well-controlled, will restart basal insulin as well as Janumet. She was referred for diabetic education and an annual eye exam. -     Hemoglobin A1c; Future -     Microalbumin / creatinine urine ratio; Future -     Ambulatory referral to Ophthalmology -     Amb Referral to Nutrition and Diabetic E -     aspirin EC 81 MG tablet; Take 1 tablet (81 mg total) by mouth daily. -     SitaGLIPtin-MetFORMIN HCl 50-1000 MG TB24; Take 1 tablet by mouth 2 (two) times daily. -     C-peptide; Future -     Insulin Glargine (BASAGLAR KWIKPEN) 100 UNIT/ML Solostar Pen; Inject 30 Units into the skin daily at 10 pm.  Left lumbar radiculitis- her neurologic exam is unremarkable, I have ordered an MRI of her lumbar spine to see if there is spinal stenosis, nerve impingement, mass, tumor or other structural cause for her discomfort -     Ambulatory referral to Physical Therapy -     MR Lumbar Spine Wo Contrast; Future  Hyperlipidemia with target LDL less than 100- she has not achieved her LDL goal, will restart the statin.   I have discontinued Ms. Figueroa's Insulin Glargine, naproxen, and predniSONE. I am also having her start on aspirin EC and Insulin Glargine. Additionally, I am having her maintain her ONETOUCH VERIO IQ SYSTEM, esomeprazole, Albuterol Sulfate, glucose blood, atorvastatin, onetouch ultrasoft, empagliflozin, and SitaGLIPtin-MetFORMIN HCl.  Meds ordered this encounter  Medications  . aspirin EC 81 MG tablet    Sig: Take 1 tablet (81 mg total) by mouth daily.    Dispense:  90 tablet     Refill:  3  . SitaGLIPtin-MetFORMIN HCl 50-1000 MG TB24    Sig: Take 1 tablet by mouth 2 (two) times daily.    Dispense:  180 tablet  Refill:  1    Schedule an appt for further refills  . Insulin Glargine (BASAGLAR KWIKPEN) 100 UNIT/ML Solostar Pen    Sig: Inject 30 Units into the skin daily at 10 pm.    Dispense:  15 mL    Refill:  11     Follow-up: Return in about 4 months (around 09/17/2015).  Scarlette Calico, MD

## 2015-05-21 ENCOUNTER — Encounter: Payer: Self-pay | Admitting: Internal Medicine

## 2015-05-21 LAB — HIV ANTIBODY (ROUTINE TESTING W REFLEX): HIV: NONREACTIVE

## 2015-05-21 LAB — HEPATITIS C ANTIBODY: HCV Ab: NEGATIVE

## 2015-05-21 LAB — C-PEPTIDE: C PEPTIDE: 2 ng/mL (ref 0.80–3.90)

## 2015-05-22 MED ORDER — ATORVASTATIN CALCIUM 20 MG PO TABS: 20.0000 mg | ORAL_TABLET | Freq: Every day | ORAL | Status: DC

## 2015-05-24 ENCOUNTER — Encounter: Payer: Self-pay | Admitting: Internal Medicine

## 2015-05-29 ENCOUNTER — Telehealth: Payer: Self-pay

## 2015-05-29 ENCOUNTER — Encounter: Payer: Self-pay | Admitting: Internal Medicine

## 2015-05-29 DIAGNOSIS — E118 Type 2 diabetes mellitus with unspecified complications: Secondary | ICD-10-CM

## 2015-05-29 NOTE — Telephone Encounter (Signed)
LMOVM spoke to pharmacy regarding Janumet. They placed it on hold script for a 62mo supply wsa 150.00 and did not want to fill until pt was sure she was going to take it. Awaiting pt to respond via mychart or phone call for the okay or wanting an alternative. Did inform PCP out of office

## 2015-06-03 MED ORDER — ONETOUCH ULTRASOFT LANCETS MISC: 1.0000 | Freq: Two times a day (BID) | Status: DC

## 2015-06-03 MED ORDER — GLUCOSE BLOOD VI STRP: ORAL_STRIP | Status: DC

## 2015-06-06 ENCOUNTER — Other Ambulatory Visit: Payer: Self-pay | Admitting: Internal Medicine

## 2015-06-25 LAB — HM DIABETES EYE EXAM

## 2015-06-26 LAB — HM DIABETES EYE EXAM

## 2015-06-30 ENCOUNTER — Encounter: Payer: Self-pay | Admitting: Internal Medicine

## 2015-07-07 ENCOUNTER — Other Ambulatory Visit: Payer: Self-pay | Admitting: Internal Medicine

## 2015-07-07 NOTE — Addendum Note (Signed)
Addended by: Etta GrandchildJONES, Latiana Tomei L on: 07/07/2015 09:39 AM   Modules accepted: Kipp BroodSmartSet

## 2015-09-17 ENCOUNTER — Encounter: Payer: Self-pay | Admitting: Internal Medicine

## 2015-09-17 MED ORDER — INSULIN PEN NEEDLE 32G X 4 MM MISC: Status: DC

## 2015-11-14 ENCOUNTER — Other Ambulatory Visit: Payer: Self-pay | Admitting: Internal Medicine

## 2015-11-14 NOTE — Telephone Encounter (Signed)
Pt needs an appt for additional refills. Sending only a 30 day supply.

## 2015-12-15 ENCOUNTER — Other Ambulatory Visit: Payer: Self-pay | Admitting: Internal Medicine

## 2015-12-15 MED ORDER — EMPAGLIFLOZIN 25 MG PO TABS: 25.0000 mg | ORAL_TABLET | Freq: Every day | ORAL | 5 refills | Status: DC

## 2016-09-22 ENCOUNTER — Ambulatory Visit (INDEPENDENT_AMBULATORY_CARE_PROVIDER_SITE_OTHER): Payer: BLUE CROSS/BLUE SHIELD | Admitting: Internal Medicine

## 2016-09-22 ENCOUNTER — Telehealth: Payer: Self-pay

## 2016-09-22 ENCOUNTER — Other Ambulatory Visit: Payer: Self-pay | Admitting: *Deleted

## 2016-09-22 ENCOUNTER — Other Ambulatory Visit (INDEPENDENT_AMBULATORY_CARE_PROVIDER_SITE_OTHER): Payer: BLUE CROSS/BLUE SHIELD

## 2016-09-22 ENCOUNTER — Encounter: Payer: Self-pay | Admitting: Internal Medicine

## 2016-09-22 VITALS — BP 116/68 | HR 81 | Temp 98.5°F | Resp 16 | Ht 61.0 in | Wt 155.5 lb

## 2016-09-22 DIAGNOSIS — E118 Type 2 diabetes mellitus with unspecified complications: Secondary | ICD-10-CM | POA: Diagnosis not present

## 2016-09-22 DIAGNOSIS — Z0000 Encounter for general adult medical examination without abnormal findings: Secondary | ICD-10-CM | POA: Diagnosis not present

## 2016-09-22 DIAGNOSIS — E1139 Type 2 diabetes mellitus with other diabetic ophthalmic complication: Secondary | ICD-10-CM

## 2016-09-22 DIAGNOSIS — Z1231 Encounter for screening mammogram for malignant neoplasm of breast: Secondary | ICD-10-CM

## 2016-09-22 DIAGNOSIS — E785 Hyperlipidemia, unspecified: Secondary | ICD-10-CM

## 2016-09-22 DIAGNOSIS — E11319 Type 2 diabetes mellitus with unspecified diabetic retinopathy without macular edema: Secondary | ICD-10-CM

## 2016-09-22 DIAGNOSIS — Z124 Encounter for screening for malignant neoplasm of cervix: Secondary | ICD-10-CM

## 2016-09-22 LAB — URINALYSIS, ROUTINE W REFLEX MICROSCOPIC
Bilirubin Urine: NEGATIVE
HGB URINE DIPSTICK: NEGATIVE
KETONES UR: NEGATIVE
NITRITE: NEGATIVE
Specific Gravity, Urine: 1.015 (ref 1.000–1.030)
TOTAL PROTEIN, URINE-UPE24: NEGATIVE
URINE GLUCOSE: NEGATIVE
UROBILINOGEN UA: 0.2 (ref 0.0–1.0)
pH: 6 (ref 5.0–8.0)

## 2016-09-22 LAB — COMPREHENSIVE METABOLIC PANEL
ALBUMIN: 3.9 g/dL (ref 3.5–5.2)
ALK PHOS: 68 U/L (ref 39–117)
ALT: 16 U/L (ref 0–35)
AST: 15 U/L (ref 0–37)
BUN: 9 mg/dL (ref 6–23)
CALCIUM: 9.2 mg/dL (ref 8.4–10.5)
CHLORIDE: 103 meq/L (ref 96–112)
CO2: 27 mEq/L (ref 19–32)
CREATININE: 0.61 mg/dL (ref 0.40–1.20)
GFR: 107.63 mL/min (ref >60.00)
Glucose, Bld: 145 mg/dL — ABNORMAL HIGH (ref 70–99)
POTASSIUM: 4.1 meq/L (ref 3.5–5.1)
SODIUM: 139 meq/L (ref 135–145)
TOTAL PROTEIN: 7.2 g/dL (ref 6.0–8.3)
Total Bilirubin: 0.7 mg/dL (ref 0.2–1.2)

## 2016-09-22 LAB — LIPID PANEL
CHOLESTEROL: 172 mg/dL (ref 0–200)
HDL: 43.2 mg/dL (ref >39.00)
LDL CALC: 101 mg/dL — AB (ref 0–99)
NonHDL: 129.02
Total CHOL/HDL Ratio: 4
Triglycerides: 141 mg/dL (ref 0.0–149.0)
VLDL: 28.2 mg/dL (ref 0.0–40.0)

## 2016-09-22 LAB — CBC WITH DIFFERENTIAL/PLATELET
BASOS PCT: 0.9 % (ref 0.0–3.0)
Basophils Absolute: 0.1 10*3/uL (ref 0.0–0.1)
Eosinophils Absolute: 0.1 10*3/uL (ref 0.0–0.7)
Eosinophils Relative: 1.8 % (ref 0.0–5.0)
HCT: 42.3 % (ref 36.0–46.0)
HEMOGLOBIN: 14.2 g/dL (ref 12.0–15.0)
Lymphocytes Relative: 37.1 % (ref 12.0–46.0)
Lymphs Abs: 2.7 10*3/uL (ref 0.7–4.0)
MCHC: 33.5 g/dL (ref 30.0–36.0)
MCV: 84.1 fl (ref 78.0–100.0)
MONO ABS: 0.5 10*3/uL (ref 0.1–1.0)
Monocytes Relative: 6.2 % (ref 3.0–12.0)
Neutro Abs: 4 10*3/uL (ref 1.4–7.7)
Neutrophils Relative %: 54 % (ref 43.0–77.0)
Platelets: 193 10*3/uL (ref 150.0–400.0)
RBC: 5.03 Mil/uL (ref 3.87–5.11)
RDW: 13.9 % (ref 11.5–15.5)
WBC: 7.3 10*3/uL (ref 4.0–10.5)

## 2016-09-22 LAB — HM DIABETES EYE EXAM

## 2016-09-22 LAB — MICROALBUMIN / CREATININE URINE RATIO
Creatinine,U: 82 mg/dL
MICROALB UR: 1.6 mg/dL (ref 0.0–1.9)
Microalb Creat Ratio: 2 mg/g (ref 0.0–30.0)

## 2016-09-22 LAB — HEMOGLOBIN A1C: HEMOGLOBIN A1C: 11.6 % — AB (ref 4.6–6.5)

## 2016-09-22 LAB — TSH: TSH: 1.84 u[IU]/mL (ref 0.35–4.50)

## 2016-09-22 MED ORDER — METFORMIN HCL 1000 MG PO TABS: 1000.0000 mg | ORAL_TABLET | Freq: Two times a day (BID) | ORAL | 1 refills | Status: DC

## 2016-09-22 MED ORDER — INSULIN PEN NEEDLE 32G X 4 MM MISC: 1 refills | Status: DC

## 2016-09-22 MED ORDER — ATORVASTATIN CALCIUM 20 MG PO TABS: 20.0000 mg | ORAL_TABLET | Freq: Every day | ORAL | 3 refills | Status: DC

## 2016-09-22 MED ORDER — INSULIN GLARGINE 100 UNIT/ML SOLOSTAR PEN: 30.0000 [IU] | PEN_INJECTOR | Freq: Every day | SUBCUTANEOUS | 11 refills | Status: DC

## 2016-09-22 MED ORDER — DAPAGLIFLOZIN PROPANEDIOL 5 MG PO TABS: 5.0000 mg | ORAL_TABLET | Freq: Every day | ORAL | 1 refills | Status: DC

## 2016-09-22 NOTE — Telephone Encounter (Signed)
Pt ;eft msg on triage stating saw MD this am forgot to ask for refill on her insulin pen. Verified chart sent pen needles to [facility].../lmb

## 2016-09-22 NOTE — Telephone Encounter (Signed)
Public [employer] Order [id number]

## 2016-09-22 NOTE — Progress Notes (Signed)
Subjective:  Patient ID: Sharon Kane, female    DOB: 1960/07/16  Age: 56 y.o. MRN: [id number]  CC: Annual Exam; Hyperlipidemia; and Diabetes   HPI Sharon Kane presents for a CPX.  She has type 2 diabetes mellitus and is noncompliant. She has not recently been taking the SGLT2 inh. She has tried to control her blood sugar with metformin and basal insulin. She complains of polys and changes in her visual acuity. She tells me that she has an eye exam later today. She's had no recent changes in her weight and denies chest pain, shortness of breath, palpitations, edema, or fatigue.  Outpatient Medications Prior to Visit  Medication Sig Dispense Refill  . Albuterol Sulfate (PROAIR RESPICLICK) 353 (90 BASE) MCG/ACT AEPB Inhale 2 puffs into the lungs 4 (four) times daily as needed. 1 each 11  . aspirin EC 81 MG tablet Take 1 tablet (81 mg total) by mouth daily. 90 tablet 3  . Blood Glucose Monitoring Suppl (ONETOUCH VERIO IQ SYSTEM) W/DEVICE KIT 1 Act by Does not apply route 2 (two) times daily. 2 kit 0  . esomeprazole (NEXIUM) 40 MG capsule Frequency:   Dosage:0   MG  Instructions:  Note:TAKE 1 CAPSULE TWICE DAILY x 2 WEEKS: THEN 1 CAPSULE DAILY THEREAFTER    . glucose blood (ONETOUCH VERIO) test strip Dx: E11.8 Test up to BID 200 each 3  . Lancets (ONETOUCH ULTRASOFT) lancets 1 each by Other route 2 (two) times daily. Use to help check blood sugars twice a day Dx E11.8 180 each 3  . atorvastatin (LIPITOR) 20 MG tablet Take 1 tablet (20 mg total) by mouth daily. 90 tablet 3  . empagliflozin (JARDIANCE) 25 MG TABS tablet Take 25 mg by mouth daily. 30 tablet 5  . Insulin Glargine (BASAGLAR KWIKPEN) 100 UNIT/ML Solostar Pen Inject 30 Units into the skin daily at 10 pm. 15 mL 11  . Insulin Pen Needle 32G X 4 MM MISC Use with Insulin Dx. E11.8 200 each 4  . metFORMIN (GLUCOPHAGE) 500 MG tablet TAKE 2 TABLETS (1,000 MG TOTAL) BY MOUTH 2 (TWO) TIMES DAILY WITH A MEAL. 360 tablet 3  .  SitaGLIPtin-MetFORMIN HCl 50-1000 MG TB24 Take 1 tablet by mouth 2 (two) times daily. 180 tablet 1   No facility-administered medications prior to visit.     ROS Review of Systems  Constitutional: Negative for appetite change, chills, fatigue and fever.  HENT: Negative.  Negative for trouble swallowing.   Eyes: Positive for visual disturbance. Negative for photophobia.  Respiratory: Negative for cough, chest tightness, shortness of breath, wheezing and stridor.   Cardiovascular: Negative for chest pain, palpitations and leg swelling.  Gastrointestinal: Negative for abdominal pain, blood in stool, constipation, diarrhea, nausea and vomiting.  Endocrine: Positive for polydipsia and polyphagia. Negative for heat intolerance and polyuria.  Genitourinary: Negative.  Negative for decreased urine volume, difficulty urinating, dysuria and urgency.  Musculoskeletal: Negative.  Negative for arthralgias, back pain, myalgias and neck pain.  Skin: Negative.   Allergic/Immunologic: Negative.   Neurological: Negative.  Negative for dizziness, weakness, light-headedness and headaches.  Hematological: Negative for adenopathy. Does not bruise/bleed easily.  Psychiatric/Behavioral: Negative.     Objective:  BP 116/68 (BP Location: Left Arm, Patient Position: Sitting, Cuff Size: Normal)   Pulse 81   Temp 98.5 F (36.9 C) (Oral)   Ht 5' 1"  (1.549 m)   Wt 155 lb 8 oz (70.5 kg)   LMP 05/03/2000   SpO2 99%   BMI 29.38  kg/m   BP Readings from Last 3 Encounters:  09/22/16 116/68  05/20/15 122/80  02/18/15 132/90    Wt Readings from Last 3 Encounters:  09/22/16 155 lb 8 oz (70.5 kg)  05/20/15 144 lb (65.3 kg)  02/18/15 144 lb 1.9 oz (65.4 kg)    Physical Exam  Constitutional: She is oriented to person, place, and time. No distress.  HENT:  Mouth/Throat: Oropharynx is clear and moist. No oropharyngeal exudate.  Eyes: Conjunctivae are normal. Right eye exhibits no discharge. Left eye exhibits  no discharge. No scleral icterus.  Neck: Normal range of motion. Neck supple. No JVD present. No tracheal deviation present. No thyromegaly present.  Cardiovascular: Normal rate, regular rhythm, normal heart sounds and intact distal pulses.  Exam reveals no gallop and no friction rub.   No murmur heard. Pulmonary/Chest: Effort normal and breath sounds normal. No stridor. No respiratory distress. She has no wheezes. She has no rales. She exhibits no tenderness.  Abdominal: Soft. Bowel sounds are normal. She exhibits no distension and no mass. There is no tenderness. There is no rebound and no guarding.  Musculoskeletal: Normal range of motion. She exhibits no edema or tenderness.  Lymphadenopathy:    She has no cervical adenopathy.  Neurological: She is oriented to person, place, and time.  Skin: Skin is warm and dry. No rash noted. She is not diaphoretic. No erythema. No pallor.  Vitals reviewed.   Lab Results  Component Value Date   WBC 7.3 09/22/2016   HGB 14.2 09/22/2016   HCT 42.3 09/22/2016   PLT 193.0 09/22/2016   GLUCOSE 145 (H) 09/22/2016   CHOL 172 09/22/2016   TRIG 141.0 09/22/2016   HDL 43.20 09/22/2016   LDLDIRECT 111.5 01/18/2013   LDLCALC 101 (H) 09/22/2016   ALT 16 09/22/2016   AST 15 09/22/2016   NA 139 09/22/2016   K 4.1 09/22/2016   CL 103 09/22/2016   CREATININE 0.61 09/22/2016   BUN 9 09/22/2016   CO2 27 09/22/2016   TSH 1.84 09/22/2016   HGBA1C 11.6 (H) 09/22/2016   MICROALBUR 1.6 09/22/2016    Dg Lumbar Spine Complete  Result Date: 02/18/2015 CLINICAL DATA:  Left back pain, fell 10 days ago EXAM: LUMBAR SPINE - COMPLETE 4+ VIEW COMPARISON:  None. FINDINGS: The lumbar vertebrae are in normal alignment. Intervertebral disc spaces appear normal. There is anterior spur formation at L2-3 and L3-4 levels. There is a well corticated defect within the inferior anterior endplate of N82. This probably represents a Schmorl's node, with mild compression less  likely. If pain persists MRI may be helpful to differentiate. The SI joints are corticated. IMPRESSION: 1. Normal alignment with only mild anterior osteophyte formation at L2-3 and L3-4. 2. Probable Schmorl's node indenting the inferior anterior aspect of T12 with compression less likely. Consider MRI if pain persists. Electronically Signed   By: Ivar Drape M.D.   On: 02/18/2015 11:14    Assessment & Plan:   Trinady was seen today for annual exam, hyperlipidemia and diabetes.  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 -     Insulin Glargine (LANTUS) 100 UNIT/ML Solostar Pen; Inject 30 Units into  the skin daily at 10 pm. -     metFORMIN (GLUCOPHAGE) 1000 MG tablet; Take 1 tablet (1,000 mg total) by mouth 2 (two) times daily with a meal. -     Cancel: Urine Microalbumin w/creat. ratio; Future -     Comprehensive metabolic panel; Future -     CBC with Differential/Platelet; Future -     Hemoglobin A1c; Future -     Urinalysis, Routine w reflex microscopic; Future -     Microalbumin / creatinine urine ratio; Future -     dapagliflozin propanediol (FARXIGA) 5 MG TABS tablet; Take 5 mg by mouth daily.  Hyperlipidemia with target LDL less than 100- she has achieved her LDL goal and is doing well on the statin. -     atorvastatin (LIPITOR) 20 MG tablet; Take 1 tablet (20 mg total) by mouth daily. -     Lipid panel; Future -     CBC with Differential/Platelet; Future -     TSH; Future  Routine general medical examination at a health care facility- exam completed, labs ordered and reviewed, vaccines reviewed, will screen for colon cancer/polyps with a Cologuard, she has been referred for a  screening Pap smear and mammogram, patient education material was given. -     Cancel: Urinalysis, Routine w reflex microscopic; Future  Type 2 diabetes mellitus with complication, without long-term current use of insulin (HCC) -     Insulin Glargine (LANTUS) 100 UNIT/ML Solostar Pen; Inject 30 Units into the skin daily at 10 pm. -     metFORMIN (GLUCOPHAGE) 1000 MG tablet; Take 1 tablet (1,000 mg total) by mouth 2 (two) times daily with a meal. -     atorvastatin (LIPITOR) 20 MG tablet; Take 1 tablet (20 mg total) by mouth daily. -     dapagliflozin propanediol (FARXIGA) 5 MG TABS tablet; Take 5 mg by mouth daily.  Screening for cervical cancer -     Ambulatory referral to Gynecology   I have discontinued Ms. Figueroa's SitaGLIPtin-MetFORMIN HCl, metFORMIN, and empagliflozin. I have also changed her Insulin Glargine. Additionally, I am having her start on metFORMIN and dapagliflozin propanediol. Lastly, I am having her maintain her ONETOUCH VERIO IQ SYSTEM, esomeprazole, Albuterol Sulfate, aspirin EC, onetouch ultrasoft, glucose blood, and atorvastatin.  Meds ordered this encounter  Medications  . Insulin Glargine (LANTUS) 100 UNIT/ML Solostar Pen    Sig: Inject 30 Units into the skin daily at 10 pm.    Dispense:  15 mL    Refill:  11  . metFORMIN (GLUCOPHAGE) 1000 MG tablet    Sig: Take 1 tablet (1,000 mg total) by mouth 2 (two) times daily with a meal.    Dispense:  180 tablet    Refill:  1  . atorvastatin (LIPITOR) 20 MG tablet    Sig: Take 1 tablet (20 mg total) by mouth daily.    Dispense:  90 tablet    Refill:  3  . dapagliflozin propanediol (FARXIGA) 5 MG TABS tablet    Sig: Take 5 mg by mouth daily.    Dispense:  90 tablet    Refill:  1     Follow-up: Return in about 4 months (around 01/23/2017).  Scarlette Calico, MD

## 2016-09-22 NOTE — Patient Instructions (Signed)

## 2016-09-23 ENCOUNTER — Encounter: Payer: Self-pay | Admitting: Internal Medicine

## 2016-09-23 MED ORDER — INSULIN GLARGINE 100 UNIT/ML SOLOSTAR PEN: 60.0000 [IU] | PEN_INJECTOR | Freq: Every day | SUBCUTANEOUS | 11 refills | Status: DC

## 2016-10-13 ENCOUNTER — Telehealth: Payer: Self-pay | Admitting: Obstetrics and Gynecology

## 2016-10-13 NOTE — Telephone Encounter (Signed)
Called and left a message for patient to call back to schedule a new patient doctor referral for AEX. °

## 2016-10-13 NOTE — Telephone Encounter (Signed)
Kit delivered to patient 

## 2016-10-18 NOTE — Telephone Encounter (Signed)
Called and left a message for patient to call back to schedule a new patient doctor referral for AEX. °

## 2016-10-20 ENCOUNTER — Other Ambulatory Visit: Payer: Self-pay | Admitting: Internal Medicine

## 2016-10-20 DIAGNOSIS — E11319 Type 2 diabetes mellitus with unspecified diabetic retinopathy without macular edema: Secondary | ICD-10-CM | POA: Insufficient documentation

## 2016-10-21 NOTE — Telephone Encounter (Signed)
Called and left a message for patient to call back to schedule a new patient doctor referral for AEX. °

## 2016-11-02 ENCOUNTER — Encounter: Payer: Self-pay | Admitting: Obstetrics and Gynecology

## 2016-11-02 ENCOUNTER — Other Ambulatory Visit: Payer: Self-pay | Admitting: Obstetrics and Gynecology

## 2016-11-02 ENCOUNTER — Other Ambulatory Visit: Payer: Self-pay | Admitting: Internal Medicine

## 2016-11-02 ENCOUNTER — Ambulatory Visit (INDEPENDENT_AMBULATORY_CARE_PROVIDER_SITE_OTHER): Payer: BLUE CROSS/BLUE SHIELD | Admitting: Obstetrics and Gynecology

## 2016-11-02 ENCOUNTER — Other Ambulatory Visit (HOSPITAL_COMMUNITY)
Admission: RE | Admit: 2016-11-02 | Discharge: 2016-11-02 | Disposition: A | Discharge status: Home / Self Care | Payer: BLUE CROSS/BLUE SHIELD | Source: Ambulatory Visit | Attending: Obstetrics and Gynecology | Admitting: Obstetrics and Gynecology

## 2016-11-02 VITALS — BP 118/70 | HR 80 | Resp 14 | Ht 61.0 in | Wt 152.0 lb

## 2016-11-02 DIAGNOSIS — Z124 Encounter for screening for malignant neoplasm of cervix: Secondary | ICD-10-CM

## 2016-11-02 DIAGNOSIS — Z01419 Encounter for gynecological examination (general) (routine) without abnormal findings: Secondary | ICD-10-CM

## 2016-11-02 DIAGNOSIS — Z1211 Encounter for screening for malignant neoplasm of colon: Secondary | ICD-10-CM

## 2016-11-02 DIAGNOSIS — B372 Candidiasis of skin and nail: Secondary | ICD-10-CM | POA: Diagnosis not present

## 2016-11-02 DIAGNOSIS — N763 Subacute and chronic vulvitis: Secondary | ICD-10-CM

## 2016-11-02 DIAGNOSIS — E118 Type 2 diabetes mellitus with unspecified complications: Secondary | ICD-10-CM

## 2016-11-02 LAB — HM PAP SMEAR

## 2016-11-02 MED ORDER — NYSTATIN 100000 UNIT/GM EX CREA: 1.0000 "application " | TOPICAL_CREAM | Freq: Two times a day (BID) | CUTANEOUS | 0 refills | Status: DC

## 2016-11-02 MED ORDER — BETAMETHASONE VALERATE 0.1 % EX OINT: TOPICAL_OINTMENT | CUTANEOUS | 0 refills | Status: DC

## 2016-11-02 NOTE — Patient Instructions (Signed)

## 2016-11-02 NOTE — Addendum Note (Signed)
Addended by: Shelda JakesHANNER, Hali Balgobin E on: 11/02/2016 11:49 AM   Modules accepted: Orders

## 2016-11-02 NOTE — Progress Notes (Signed)
56 y.o. [id number] [name] here for annual exam.  She c/o chronic vulvar irritation and itching, comes and goes in the panty line and the labia. No abnormal d/c. Over the last year she has daily vulvar irritation.  No vaginal bleeding. Sexually active, dry, helped with lubricant.  H/O diabetes, last hgbA1C was 11.6%.     Patient's last menstrual period was 05/03/2000.          Sexually active: Yes.    The current method of family planning is tubal ligation/ postmenopause.    Exercising: Yes.    walking/ yard work/ bicycle  Smoker:  no  Health Maintenance: Pap:  2015 WNL per patient  History of abnormal Pap:  no MMG:  03-01-13 WNL  Colonoscopy:  Never BMD:   Never TDaP:  06-11-13  Gardasil: N/A   reports that she has never smoked. She has never used smokeless tobacco. She reports that she does not drink alcohol or use drugs. Works as a Landscape architect. 3 kids all grown, 2 grandsons 73 and 79. Son and grand kids in [place]. Daughters are both Radiation protection practitioner.   Past Medical History:  Diagnosis Date  . Diabetes mellitus without complication ([place])   . Hyperlipidemia     Past Surgical History:  Procedure Laterality Date  . TUBAL LIGATION      Current Outpatient Prescriptions  Medication Sig Dispense Refill  . atorvastatin (LIPITOR) 20 MG tablet Take 1 tablet (20 mg total) by mouth daily. 90 tablet 3  . Blood Glucose Monitoring Suppl (ONETOUCH VERIO IQ SYSTEM) W/DEVICE KIT 1 Act by Does not apply route 2 (two) times daily. 2 kit 0  . dapagliflozin propanediol (FARXIGA) 5 MG TABS tablet Take 5 mg by mouth daily. 90 tablet 1  . glucose blood (ONETOUCH VERIO) test strip Dx: E11.8 Test up to BID 200 each 3  . Insulin Glargine (LANTUS) 100 UNIT/ML Solostar Pen Inject 60 Units into the skin daily at 10 pm. 15 mL 11  . Insulin Pen Needle 31G X 5 MM MISC by Does not apply route. Use to help administer lantus insulin    . Insulin Pen Needle 32G X 4 MM MISC Use with Insulin Dx. E11.8 200 each 1  .  Lancets (ONETOUCH ULTRASOFT) lancets 1 each by Other route 2 (two) times daily. Use to help check blood sugars twice a day Dx E11.8 180 each 3  . metFORMIN (GLUCOPHAGE) 1000 MG tablet Take 1 tablet (1,000 mg total) by mouth 2 (two) times daily with a meal. 180 tablet 1   No current facility-administered medications for this visit.     Family History  Problem Relation Age of Onset  . Hypertension Mother   . Aneurysm Father   . Early death Father   . Diabetes Maternal Aunt   . Cancer Neg Hx   . COPD Neg Hx   . Depression Neg Hx   . Alcohol abuse Neg Hx   . Drug abuse Neg Hx   . Heart disease Neg Hx   . Hyperlipidemia Neg Hx   . Kidney disease Neg Hx   Father died of a brain aneurysm at 37 when she was 86  Review of Systems  Constitutional: Negative.   HENT: Negative.   Eyes: Negative.   Respiratory: Negative.   Cardiovascular: Negative.   Gastrointestinal: Negative.   Endocrine: Negative.   Genitourinary:       Vaginal rash and itching   Musculoskeletal: Negative.   Skin: Negative.   Allergic/Immunologic: Negative.  Neurological: Negative.   Psychiatric/Behavioral: Negative.     Exam:   BP 118/70 (BP Location: Right Arm, Patient Position: Sitting, Cuff Size: Normal)   Pulse 80   Resp 14   Ht 5' 1"  (1.549 m)   Wt 152 lb (68.9 kg)   LMP 05/03/2000   BMI 28.72 kg/m   Weight change: @WEIGHTCHANGE @ Height:   Height: 5' 1"  (154.9 cm)  Ht Readings from Last 3 Encounters:  11/02/16 5' 1"  (1.549 m)  09/22/16 5' 1"  (1.549 m)  05/20/15 5' 1"  (1.549 m)    General appearance: alert, cooperative and appears stated age Head: Normocephalic, without obvious abnormality, atraumatic Neck: no adenopathy, supple, symmetrical, trachea midline and thyroid normal to inspection and palpation Lungs: clear to auscultation bilaterally Cardiovascular: regular rate and rhythm Breasts: normal appearance, no masses or tenderness Abdomen: soft, non-tender; bowel sounds normal; no masses,   no organomegaly Extremities: extremities normal, atraumatic, no cyanosis or edema Skin: Skin color, texture, turgor normal. No rashes or lesions Lymph nodes: Cervical, supraclavicular, and axillary nodes normal. No abnormal inguinal nodes palpated Neurologic: Grossly normal   Pelvic: External genitalia:  no lesions, erythematous, no whitening, no significant agglutination, fissure above her clitoris. In the bilateral groin is a patchy erythematous rash c/w candida intertrigo.               Urethra:  normal appearing urethra with no masses, tenderness or lesions              Bartholins and Skenes: normal                 Vagina: normal appearing vagina with normal color and discharge, no lesions, no erythema              Cervix: no lesions               Bimanual Exam:  Uterus:  normal size, contour, position, consistency, mobility, non-tender              Adnexa: no mass, fullness, tenderness               Rectovaginal: Confirms               Anus:  normal sphincter tone, no lesions  Chaperone was present for exam.  A:  Well Woman with normal exam  Candida intertrigo bilateral groin  Vulvar erythema, fissure  P:   Pap with hpv  Mammogram  Colonoscopy  Labs with primary  Discussed breast self exam  Discussed calcium and vit D intake  Nystatin cream to bilateral groin  Steroid ointment to vulva  Send affirm  Discussed her diabetes being out of control is worsening her candida intertrigo  Vulvar skin care information given  F/U in 2 weeks

## 2016-11-04 LAB — CYTOLOGY - PAP
Diagnosis: NEGATIVE
HPV: NOT DETECTED

## 2016-11-05 LAB — VAGINITIS/VAGINOSIS, DNA PROBE
Candida Species: NEGATIVE
Gardnerella vaginalis: NEGATIVE
Trichomonas vaginosis: NEGATIVE

## 2016-11-05 LAB — WET PREP FOR TRICH, YEAST, CLUE

## 2016-11-15 ENCOUNTER — Telehealth: Payer: Self-pay | Admitting: Obstetrics and Gynecology

## 2016-11-15 NOTE — Telephone Encounter (Signed)
Patient was scheduled for colonoscopy consultation on 11-16-16. She cancelled this appointment without rescheduling. The scheduler noted the same happened in 2014.   Routing to provider for review. Will close encounter.

## 2017-03-23 ENCOUNTER — Other Ambulatory Visit: Payer: Self-pay | Admitting: Internal Medicine

## 2017-03-23 DIAGNOSIS — E118 Type 2 diabetes mellitus with unspecified complications: Secondary | ICD-10-CM

## 2017-03-23 DIAGNOSIS — E1139 Type 2 diabetes mellitus with other diabetic ophthalmic complication: Secondary | ICD-10-CM

## 2017-04-09 ENCOUNTER — Other Ambulatory Visit: Payer: Self-pay | Admitting: Internal Medicine

## 2017-04-09 DIAGNOSIS — E118 Type 2 diabetes mellitus with unspecified complications: Secondary | ICD-10-CM

## 2017-04-09 DIAGNOSIS — E1139 Type 2 diabetes mellitus with other diabetic ophthalmic complication: Secondary | ICD-10-CM

## 2017-06-17 NOTE — Telephone Encounter (Signed)
Order cancelled

## 2017-06-21 ENCOUNTER — Other Ambulatory Visit: Payer: Self-pay | Admitting: Internal Medicine

## 2017-06-21 DIAGNOSIS — E118 Type 2 diabetes mellitus with unspecified complications: Secondary | ICD-10-CM

## 2017-06-21 DIAGNOSIS — E1139 Type 2 diabetes mellitus with other diabetic ophthalmic complication: Secondary | ICD-10-CM

## 2017-09-30 ENCOUNTER — Other Ambulatory Visit: Payer: Self-pay | Admitting: Internal Medicine

## 2017-09-30 DIAGNOSIS — E785 Hyperlipidemia, unspecified: Secondary | ICD-10-CM

## 2017-09-30 DIAGNOSIS — E118 Type 2 diabetes mellitus with unspecified complications: Secondary | ICD-10-CM

## 2017-10-18 ENCOUNTER — Telehealth: Payer: Self-pay | Admitting: Internal Medicine

## 2017-10-18 NOTE — Telephone Encounter (Signed)
VM left to clarify:   Medication not on current med list. Was discontinued 09/27/17 by Dr. Yetta BarreJones

## 2017-10-18 NOTE — Telephone Encounter (Signed)
Copied from CRM [id number]. Topic: Quick Communication - Rx Refill/Question >> Oct 18, 2017  9:59 AM Rudi CocoLathan, Yi Falletta M, NT wrote: Medication:empagliflozin (JARDIANCE) 25 MG TABS tablet   Has the patient contacted their pharmacy? no (Agent: If no, request that the patient contact the pharmacy for the refill.) (Agent: If yes, when and what did the pharmacy advise?)  Preferred Pharmacy (with phone number or street name):[facility] [phone][id number] [place]- [place], [place] - [address] [place] [address] Phone: [phone] Fax: [phone]    Agent: Please be advised that RX refills may take up to 3 business days. We ask that you follow-up with your pharmacy.

## 2017-11-16 ENCOUNTER — Encounter: Payer: Self-pay | Admitting: Internal Medicine

## 2017-11-16 ENCOUNTER — Other Ambulatory Visit (INDEPENDENT_AMBULATORY_CARE_PROVIDER_SITE_OTHER): Payer: BLUE CROSS/BLUE SHIELD

## 2017-11-16 ENCOUNTER — Ambulatory Visit (INDEPENDENT_AMBULATORY_CARE_PROVIDER_SITE_OTHER): Payer: BLUE CROSS/BLUE SHIELD | Admitting: Internal Medicine

## 2017-11-16 VITALS — BP 134/86 | HR 81 | Temp 98.7°F | Ht 61.0 in | Wt 148.0 lb

## 2017-11-16 DIAGNOSIS — E11319 Type 2 diabetes mellitus with unspecified diabetic retinopathy without macular edema: Secondary | ICD-10-CM | POA: Diagnosis not present

## 2017-11-16 DIAGNOSIS — E1139 Type 2 diabetes mellitus with other diabetic ophthalmic complication: Secondary | ICD-10-CM

## 2017-11-16 DIAGNOSIS — E118 Type 2 diabetes mellitus with unspecified complications: Secondary | ICD-10-CM

## 2017-11-16 DIAGNOSIS — Z124 Encounter for screening for malignant neoplasm of cervix: Secondary | ICD-10-CM

## 2017-11-16 DIAGNOSIS — Z1211 Encounter for screening for malignant neoplasm of colon: Secondary | ICD-10-CM

## 2017-11-16 DIAGNOSIS — Z0000 Encounter for general adult medical examination without abnormal findings: Secondary | ICD-10-CM | POA: Diagnosis not present

## 2017-11-16 DIAGNOSIS — E785 Hyperlipidemia, unspecified: Secondary | ICD-10-CM

## 2017-11-16 DIAGNOSIS — Z1231 Encounter for screening mammogram for malignant neoplasm of breast: Secondary | ICD-10-CM

## 2017-11-16 DIAGNOSIS — E119 Type 2 diabetes mellitus without complications: Secondary | ICD-10-CM

## 2017-11-16 LAB — URINALYSIS, ROUTINE W REFLEX MICROSCOPIC
BILIRUBIN URINE: NEGATIVE
HGB URINE DIPSTICK: NEGATIVE
Ketones, ur: NEGATIVE
LEUKOCYTES UA: NEGATIVE
NITRITE: NEGATIVE
RBC / HPF: NONE SEEN (ref 0–?)
Specific Gravity, Urine: 1.025 (ref 1.000–1.030)
Total Protein, Urine: NEGATIVE
UROBILINOGEN UA: 0.2 (ref 0.0–1.0)
Urine Glucose: >=1000 — AB
pH: 5 (ref 5.0–8.0)

## 2017-11-16 LAB — CBC WITH DIFFERENTIAL/PLATELET
BASOS ABS: 0.1 10*3/uL (ref 0.0–0.1)
Basophils Relative: 0.7 % (ref 0.0–3.0)
EOS PCT: 1.1 % (ref 0.0–5.0)
Eosinophils Absolute: 0.1 10*3/uL (ref 0.0–0.7)
HEMATOCRIT: 39.5 % (ref 36.0–46.0)
Hemoglobin: 13.2 g/dL (ref 12.0–15.0)
LYMPHS ABS: 3 10*3/uL (ref 0.7–4.0)
Lymphocytes Relative: 40.7 % (ref 12.0–46.0)
MCHC: 33.4 g/dL (ref 30.0–36.0)
MCV: 85.2 fl (ref 78.0–100.0)
MONOS PCT: 5.8 % (ref 3.0–12.0)
Monocytes Absolute: 0.4 10*3/uL (ref 0.1–1.0)
NEUTROS ABS: 3.8 10*3/uL (ref 1.4–7.7)
NEUTROS PCT: 51.7 % (ref 43.0–77.0)
PLATELETS: 178 10*3/uL (ref 150.0–400.0)
RBC: 4.64 Mil/uL (ref 3.87–5.11)
RDW: 13.5 % (ref 11.5–15.5)
WBC: 7.4 10*3/uL (ref 4.0–10.5)

## 2017-11-16 LAB — COMPREHENSIVE METABOLIC PANEL
ALT: 15 U/L (ref 0–35)
AST: 11 U/L (ref 0–37)
Albumin: 3.8 g/dL (ref 3.5–5.2)
Alkaline Phosphatase: 61 U/L (ref 39–117)
BILIRUBIN TOTAL: 0.6 mg/dL (ref 0.2–1.2)
BUN: 13 mg/dL (ref 6–23)
CALCIUM: 8.8 mg/dL (ref 8.4–10.5)
CHLORIDE: 102 meq/L (ref 96–112)
CO2: 29 meq/L (ref 19–32)
Creatinine, Ser: 0.65 mg/dL (ref 0.40–1.20)
GFR: 99.61 mL/min (ref >60.00)
GLUCOSE: 269 mg/dL — AB (ref 70–99)
POTASSIUM: 4 meq/L (ref 3.5–5.1)
Sodium: 137 mEq/L (ref 135–145)
Total Protein: 7.2 g/dL (ref 6.0–8.3)

## 2017-11-16 LAB — MICROALBUMIN / CREATININE URINE RATIO
Creatinine,U: 68.6 mg/dL
Microalb Creat Ratio: 2.1 mg/g (ref 0.0–30.0)
Microalb, Ur: 1.4 mg/dL (ref 0.0–1.9)

## 2017-11-16 LAB — HEMOGLOBIN A1C: HEMOGLOBIN A1C: 11.2 % — AB (ref 4.6–6.5)

## 2017-11-16 LAB — LIPID PANEL
CHOL/HDL RATIO: 4
CHOLESTEROL: 157 mg/dL (ref 0–200)
HDL: 41.3 mg/dL (ref >39.00)
LDL Cholesterol: 90 mg/dL (ref 0–99)
NonHDL: 116.1
TRIGLYCERIDES: 130 mg/dL (ref 0.0–149.0)
VLDL: 26 mg/dL (ref 0.0–40.0)

## 2017-11-16 LAB — TSH: TSH: 1.41 u[IU]/mL (ref 0.35–4.50)

## 2017-11-16 LAB — HM DIABETES FOOT EXAM

## 2017-11-16 MED ORDER — ATORVASTATIN CALCIUM 20 MG PO TABS: 20.0000 mg | ORAL_TABLET | Freq: Every day | ORAL | 3 refills | Status: DC

## 2017-11-16 MED ORDER — GLUCOSE BLOOD VI STRP: ORAL_STRIP | 3 refills | Status: DC

## 2017-11-16 MED ORDER — ONETOUCH ULTRASOFT LANCETS MISC: 1.0000 | Freq: Two times a day (BID) | 3 refills | Status: AC

## 2017-11-16 MED ORDER — METFORMIN HCL 1000 MG PO TABS: 1000.0000 mg | ORAL_TABLET | Freq: Two times a day (BID) | ORAL | 3 refills | Status: DC

## 2017-11-16 MED ORDER — BASAGLAR KWIKPEN 100 UNIT/ML ~~LOC~~ SOPN: 30.0000 [IU] | PEN_INJECTOR | Freq: Every day | SUBCUTANEOUS | 1 refills | Status: DC

## 2017-11-16 MED ORDER — DAPAGLIFLOZIN PROPANEDIOL 5 MG PO TABS: 5.0000 mg | ORAL_TABLET | Freq: Every day | ORAL | 3 refills | Status: DC

## 2017-11-16 MED ORDER — ONETOUCH VERIO IQ SYSTEM W/DEVICE KIT: 1.0000 | PACK | Freq: Two times a day (BID) | 0 refills | Status: AC

## 2017-11-16 NOTE — Progress Notes (Signed)
Subjective:  Patient ID: Sharon Kane, female    DOB: 07/05/1960  Age: 57 y.o. MRN: [id number]  CC: Annual Exam; Hyperlipidemia; and Diabetes   HPI Sharon Kane presents for a CPX.  She tells me her blood sugars have been consistently greater than 200 for the past month.  She tells me she ran out of her diabetic meds about 3 weeks ago.  She complains of polydipsia and polyphagia.  She denies polyuria or abdominal pain.  She has also had a few episodes of blurred vision.  She denies CP, DOE, palpitations, edema, or fatigue.  Outpatient Medications Prior to Visit  Medication Sig Dispense Refill  . Insulin Pen Needle 31G X 5 MM MISC by Does not apply route. Use to help administer lantus insulin    . Insulin Pen Needle 32G X 4 MM MISC Use with Insulin Dx. E11.8 200 each 1  . atorvastatin (LIPITOR) 20 MG tablet Take 1 tablet (20 mg total) by mouth daily. 90 tablet 3  . betamethasone valerate ointment (VALISONE) 0.1 % Apply a pea sized amount topically to the vulva bid x 1-2 weeks as needed 30 g 0  . Blood Glucose Monitoring Suppl (ONETOUCH VERIO IQ SYSTEM) W/DEVICE KIT 1 Act by Does not apply route 2 (two) times daily. 2 kit 0  . dapagliflozin propanediol (FARXIGA) 5 MG TABS tablet Take 5 mg by mouth daily. 90 tablet 1  . glucose blood (ONETOUCH VERIO) test strip Dx: E11.8 Test up to BID 200 each 3  . Insulin Glargine (BASAGLAR KWIKPEN) 100 UNIT/ML SOPN INJECT 30 UNITS INTO THE SKIN DAILY AT 10 PM. 15 mL 1  . Insulin Glargine (LANTUS) 100 UNIT/ML Solostar Pen Inject 60 Units into the skin daily at 10 pm. 15 mL 11  . Lancets (ONETOUCH ULTRASOFT) lancets 1 each by Other route 2 (two) times daily. Use to help check blood sugars twice a day Dx E11.8 180 each 3  . metFORMIN (GLUCOPHAGE) 1000 MG tablet Take 1 tablet (1,000 mg total) by mouth 2 (two) times daily with a meal. -- Office visit needed for further refills 60 tablet 0  . nystatin cream (MYCOSTATIN) Apply 1 application topically 2  (two) times daily. Apply to bilateral groin BID for up to 14 days. 30 g 0   No facility-administered medications prior to visit.     ROS Review of Systems  Constitutional: Negative for diaphoresis, fatigue and unexpected weight change.  HENT: Negative.   Eyes: Positive for visual disturbance. Negative for photophobia and pain.  Respiratory: Negative for cough, chest tightness, shortness of breath and wheezing.   Cardiovascular: Negative for chest pain, palpitations and leg swelling.  Gastrointestinal: Negative for abdominal pain, blood in stool, constipation, diarrhea, nausea and vomiting.  Endocrine: Positive for polydipsia and polyphagia. Negative for polyuria.  Genitourinary: Negative.  Negative for decreased urine volume, difficulty urinating, dysuria and urgency.  Musculoskeletal: Negative.  Negative for arthralgias and myalgias.  Skin: Negative.  Negative for color change and pallor.  Neurological: Negative.  Negative for dizziness, weakness and light-headedness.  Hematological: Negative for adenopathy. Does not bruise/bleed easily.  Psychiatric/Behavioral: Negative.     Objective:  BP 134/86   Pulse 81   Temp 98.7 F (37.1 C) (Oral)   Ht 5' 1"  (1.549 m)   Wt 148 lb (67.1 kg)   LMP 05/03/2000   SpO2 98%   BMI 27.96 kg/m   BP Readings from Last 3 Encounters:  11/16/17 134/86  11/02/16 118/70  09/22/16 116/68  Wt Readings from Last 3 Encounters:  11/16/17 148 lb (67.1 kg)  11/02/16 152 lb (68.9 kg)  09/22/16 155 lb 8 oz (70.5 kg)    Physical Exam  Constitutional: She is oriented to person, place, and time. No distress.  HENT:  Mouth/Throat: Oropharynx is clear and moist. No oropharyngeal exudate.  Eyes: Conjunctivae are normal. No scleral icterus.  Neck: Normal range of motion. Neck supple. No JVD present. No thyromegaly present.  Cardiovascular: Normal rate, regular rhythm and normal heart sounds.  No murmur heard. Pulmonary/Chest: Effort normal and breath  sounds normal. She has no wheezes. She has no rales.  Abdominal: Soft. Bowel sounds are normal. She exhibits no mass. There is no hepatosplenomegaly. There is no tenderness.  Musculoskeletal: Normal range of motion. She exhibits no edema, tenderness or deformity.  Lymphadenopathy:    She has no cervical adenopathy.  Neurological: She is alert and oriented to person, place, and time.  Skin: Skin is warm and dry. She is not diaphoretic. No pallor.  Psychiatric: She has a normal mood and affect. Her behavior is normal. Judgment and thought content normal.  Vitals reviewed.   Lab Results  Component Value Date   WBC 7.4 11/16/2017   HGB 13.2 11/16/2017   HCT 39.5 11/16/2017   PLT 178.0 11/16/2017   GLUCOSE 269 (H) 11/16/2017   CHOL 157 11/16/2017   TRIG 130.0 11/16/2017   HDL 41.30 11/16/2017   LDLDIRECT 111.5 01/18/2013   LDLCALC 90 11/16/2017   ALT 15 11/16/2017   AST 11 11/16/2017   NA 137 11/16/2017   K 4.0 11/16/2017   CL 102 11/16/2017   CREATININE 0.65 11/16/2017   BUN 13 11/16/2017   CO2 29 11/16/2017   TSH 1.41 11/16/2017   HGBA1C 11.2 (H) 11/16/2017   MICROALBUR 1.4 11/16/2017    No results found.  Assessment & Plan:   Sharon Kane was seen today for annual exam, hyperlipidemia and diabetes.  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  Her blood sugars are not adequately well controlled.  Will restart her usual meds for blood sugar control and I have asked her to follow-up with endocrinology and to have her annual eye exam as well. -     CBC with Differential/Platelet; Future -     Comprehensive metabolic panel; Future -     Urinalysis, Routine w reflex microscopic; Future -     Hemoglobin A1c; Future -     Microalbumin / creatinine urine ratio; Future -     dapagliflozin propanediol (FARXIGA) 5 MG TABS tablet; Take 5 mg by mouth daily. -     metFORMIN (GLUCOPHAGE) 1000 MG  tablet; Take 1 tablet (1,000 mg total) by mouth 2 (two) times daily with a meal. -- Office visit needed for further refills -     Ambulatory referral to Endocrinology  Diabetic retinopathy without macular edema associated with type 2 diabetes mellitus, unspecified laterality, unspecified retinopathy severity ([place])  Hyperlipidemia with target LDL less than 100 -she has achieved her LDL goal and is doing well on the statin. -     TSH; Future -     atorvastatin (LIPITOR) 20 MG tablet; Take 1 tablet (20 mg total) by mouth daily.  Routine general medical examination at a health care facility- Exam completed, labs reviewed, vaccines reviewed. She has not yet been screened for colon cancer so I referred again for that, she is referred  for screening mammogram, her Pap smear is up-to-date. Patient education material was given. -     Lipid panel; Future  Visit for screening mammogram -     MM DIGITAL SCREENING BILATERAL; Future  Type 2 diabetes mellitus with complication, without long-term current use of insulin (HCC) -     atorvastatin (LIPITOR) 20 MG tablet; Take 1 tablet (20 mg total) by mouth daily. -     dapagliflozin propanediol (FARXIGA) 5 MG TABS tablet; Take 5 mg by mouth daily. -     glucose blood (ONETOUCH VERIO) test strip; Dx: E11.8 Test up to BID -     metFORMIN (GLUCOPHAGE) 1000 MG tablet; Take 1 tablet (1,000 mg total) by mouth 2 (two) times daily with a meal. -- Office visit needed for further refills -     Insulin Glargine (BASAGLAR KWIKPEN) 100 UNIT/ML SOPN; Inject 0.3 mLs (30 Units total) into the skin daily at 10 pm. -     Blood Glucose Monitoring Suppl (ONETOUCH VERIO IQ SYSTEM) w/Device KIT; 1 Act by Does not apply route 2 (two) times daily. -     Ambulatory referral to Ophthalmology -     Amb Referral to Nutrition and Diabetic E  Diabetes mellitus without complication (HCC) -     Insulin Glargine (BASAGLAR KWIKPEN) 100 UNIT/ML SOPN; Inject 0.3 mLs (30 Units total) into the  skin daily at 10 pm. -     Blood Glucose Monitoring Suppl (ONETOUCH VERIO IQ SYSTEM) w/Device KIT; 1 Act by Does not apply route 2 (two) times daily. -     Ambulatory referral to Ophthalmology  Screening for cervical cancer  Colon cancer screening -     Ambulatory referral to Gastroenterology  Other orders -     Lancets (ONETOUCH ULTRASOFT) lancets; 1 each by Other route 2 (two) times daily. Use to help check blood sugars twice a day Dx E11.8   I have discontinued Sharon Kane's Insulin Glargine, nystatin cream, and betamethasone valerate ointment. I have also changed her [facility]. Additionally, I am having her maintain her Insulin Pen Needle, Insulin Pen Needle, atorvastatin, dapagliflozin propanediol, glucose blood, metFORMIN, onetouch ultrasoft, and ONETOUCH VERIO IQ SYSTEM.  Meds ordered this encounter  Medications  . atorvastatin (LIPITOR) 20 MG tablet    Sig: Take 1 tablet (20 mg total) by mouth daily.    Dispense:  90 tablet    Refill:  3  . dapagliflozin propanediol (FARXIGA) 5 MG TABS tablet    Sig: Take 5 mg by mouth daily.    Dispense:  90 tablet    Refill:  3  . glucose blood (ONETOUCH VERIO) test strip    Sig: Dx: E11.8 Test up to BID    Dispense:  200 each    Refill:  3  . metFORMIN (GLUCOPHAGE) 1000 MG tablet    Sig: Take 1 tablet (1,000 mg total) by mouth 2 (two) times daily with a meal. -- Office visit needed for further refills    Dispense:  180 tablet    Refill:  3  . Lancets (ONETOUCH ULTRASOFT) lancets    Sig: 1 each by Other route 2 (two) times daily. Use to help check blood sugars twice a day Dx E11.8    Dispense:  180 each    Refill:  3  . Insulin Glargine (BASAGLAR KWIKPEN) 100 UNIT/ML SOPN    Sig: Inject 0.3 mLs (30 Units total) into the skin daily at 10 pm.    Dispense:  15 mL  Refill:  1  . Blood Glucose Monitoring Suppl (ONETOUCH VERIO IQ SYSTEM) w/Device KIT    Sig: 1 Act by Does not apply route 2 (two) times daily.    Dispense:   2 kit    Refill:  0     Follow-up: Return in about 4 months (around 03/19/2018).  Scarlette Calico, MD

## 2017-11-16 NOTE — Patient Instructions (Signed)

## 2017-12-01 DIAGNOSIS — E113393 Type 2 diabetes mellitus with moderate nonproliferative diabetic retinopathy without macular edema, bilateral: Secondary | ICD-10-CM | POA: Diagnosis not present

## 2017-12-01 DIAGNOSIS — E119 Type 2 diabetes mellitus without complications: Secondary | ICD-10-CM | POA: Diagnosis not present

## 2017-12-01 DIAGNOSIS — H25013 Cortical age-related cataract, bilateral: Secondary | ICD-10-CM | POA: Diagnosis not present

## 2017-12-01 DIAGNOSIS — H2513 Age-related nuclear cataract, bilateral: Secondary | ICD-10-CM | POA: Diagnosis not present

## 2017-12-01 DIAGNOSIS — H524 Presbyopia: Secondary | ICD-10-CM | POA: Diagnosis not present

## 2017-12-01 LAB — HM DIABETES EYE EXAM

## 2017-12-13 ENCOUNTER — Encounter: Payer: Self-pay | Admitting: Internal Medicine

## 2018-01-25 ENCOUNTER — Encounter: Payer: Self-pay | Admitting: Internal Medicine

## 2018-02-14 ENCOUNTER — Encounter: Payer: Self-pay | Admitting: Endocrinology

## 2018-02-15 ENCOUNTER — Other Ambulatory Visit: Payer: Self-pay | Admitting: Internal Medicine

## 2018-02-15 DIAGNOSIS — E118 Type 2 diabetes mellitus with unspecified complications: Secondary | ICD-10-CM

## 2018-02-15 DIAGNOSIS — E119 Type 2 diabetes mellitus without complications: Secondary | ICD-10-CM

## 2018-02-22 ENCOUNTER — Ambulatory Visit: Payer: BLUE CROSS/BLUE SHIELD | Admitting: Obstetrics and Gynecology

## 2018-02-22 ENCOUNTER — Other Ambulatory Visit: Payer: Self-pay

## 2018-02-22 ENCOUNTER — Encounter: Payer: Self-pay | Admitting: Obstetrics and Gynecology

## 2018-02-22 VITALS — BP 130/82 | HR 68 | Ht 60.24 in | Wt 147.6 lb

## 2018-02-22 DIAGNOSIS — Z8639 Personal history of other endocrine, nutritional and metabolic disease: Secondary | ICD-10-CM | POA: Diagnosis not present

## 2018-02-22 DIAGNOSIS — Z01419 Encounter for gynecological examination (general) (routine) without abnormal findings: Secondary | ICD-10-CM | POA: Diagnosis not present

## 2018-02-22 DIAGNOSIS — N763 Subacute and chronic vulvitis: Secondary | ICD-10-CM

## 2018-02-22 MED ORDER — BETAMETHASONE VALERATE 0.1 % EX OINT: TOPICAL_OINTMENT | CUTANEOUS | 0 refills | Status: DC

## 2018-02-22 NOTE — Progress Notes (Signed)
57 y.o. [id number] Single  female here for annual exam.   She has intermittent issues with candida intertrigo in the panty line. No vaginal bleeding. Sexually active, same partner x 38 hours, recently got married. No dyspareunia. No bowel or bladder c/o.  H/O poorly controlled diabetes. Last HgbA1C was 11.2 in 7/19.     Patient's last menstrual period was 05/03/2000.          Sexually active: Yes.    The current method of family planning is post menopausal status.  Exercising: Yes.    walking, biking Smoker:  no  Health Maintenance: Pap: 11/01/2016 negative with negative HR HPV History of abnormal Pap:  No MMG:  03-01-13 Birads 2 benign, she will schedule.  Colonoscopy:  Never, planning to schedule it. Referral placed by primary BMD:   Never TDaP:  06-11-13  Gardasil: N/A   reports that she has never smoked. She has never used smokeless tobacco. She reports that she does not drink alcohol or use drugs. Works as a Landscape architect. 3 kids all grown, 2 grandsons. Son and grand kids in [place]. Daughters are both local  Past Medical History:  Diagnosis Date  . Diabetes mellitus without complication ([place])   . Hyperlipidemia     Past Surgical History:  Procedure Laterality Date  . TUBAL LIGATION      Current Outpatient Medications  Medication Sig Dispense Refill  . atorvastatin (LIPITOR) 20 MG tablet Take 1 tablet (20 mg total) by mouth daily. 90 tablet 3  . Blood Glucose Monitoring Suppl (ONETOUCH VERIO IQ SYSTEM) w/Device KIT 1 Act by Does not apply route 2 (two) times daily. 2 kit 0  . dapagliflozin propanediol (FARXIGA) 5 MG TABS tablet Take 5 mg by mouth daily. 90 tablet 3  . glucose blood (ONETOUCH VERIO) test strip Dx: E11.8 Test up to BID 200 each 3  . Insulin Glargine (BASAGLAR KWIKPEN) 100 UNIT/ML SOPN INJECT 0.3 MLS (30 UNITS TOTAL) INTO THE SKIN DAILY AT 10 PM. 15 mL 1  . Insulin Pen Needle 31G X 5 MM MISC by Does not apply route. Use to help administer lantus insulin    .  Insulin Pen Needle 32G X 4 MM MISC Use with Insulin Dx. E11.8 200 each 1  . Lancets (ONETOUCH ULTRASOFT) lancets 1 each by Other route 2 (two) times daily. Use to help check blood sugars twice a day Dx E11.8 180 each 3  . metFORMIN (GLUCOPHAGE) 1000 MG tablet Take 1 tablet (1,000 mg total) by mouth 2 (two) times daily with a meal. -- Office visit needed for further refills 180 tablet 3   No current facility-administered medications for this visit.     Family History  Problem Relation Age of Onset  . Hypertension Mother   . Aneurysm Father   . Early death Father   . Diabetes Maternal Aunt   . Cancer Neg Hx   . COPD Neg Hx   . Depression Neg Hx   . Alcohol abuse Neg Hx   . Drug abuse Neg Hx   . Heart disease Neg Hx   . Hyperlipidemia Neg Hx   . Kidney disease Neg Hx     Review of Systems  Constitutional: Negative.   HENT: Negative.   Eyes: Negative.   Respiratory: Negative.   Cardiovascular: Negative.   Gastrointestinal: Negative.   Endocrine: Negative.   Genitourinary:       Vaginal itching  Musculoskeletal: Negative.   Skin: Negative.   Allergic/Immunologic: Negative.   Neurological:  Negative.   Hematological: Negative.   Psychiatric/Behavioral: Negative.     Exam:   BP 130/82 (BP Location: Right Arm, Patient Position: Sitting, Cuff Size: Normal)   Pulse 68   Ht 5' 0.24" (1.53 m)   Wt 147 lb 9.6 oz (67 kg)   LMP 05/03/2000   BMI 28.60 kg/m   Weight change: _0 @ Height:   Height: 5' 0.24" (153 cm)  Ht Readings from Last 3 Encounters:  02/22/18 5' 0.24" (1.53 m)  11/16/17 _1  (1.549 m)  11/02/16 _2  (1.549 m)    General appearance: alert, cooperative and appears stated age Head: Normocephalic, without obvious abnormality, atraumatic Neck: no adenopathy, supple, symmetrical, trachea midline and thyroid normal to inspection and palpation Lungs: clear to auscultation bilaterally Cardiovascular: regular rate and rhythm Breasts: normal appearance,  no masses or tenderness Abdomen: soft, non-tender; non distended,  no masses,  no organomegaly Extremities: extremities normal, atraumatic, no cyanosis or edema Skin: Skin color, texture, turgor normal. No rashes or lesions Lymph nodes: Cervical, supraclavicular, and axillary nodes normal. No abnormal inguinal nodes palpated Neurologic: Grossly normal   Pelvic: External genitalia:  no lesions, marked erythema of the vulva and perianal region, mild fissures               Urethra:  normal appearing urethra with no masses, tenderness or lesions              Bartholins and Skenes: normal                 Vagina: normal appearing vagina with normal color and discharge, no lesions              Cervix: no lesions               Bimanual Exam:  Uterus:  normal size, contour, position, consistency, mobility, non-tender and anteverted              Adnexa: no mass, fullness, tenderness               Rectovaginal: Confirms               Anus:  normal sphincter tone, no lesions  Chaperone was present for exam.  A:  Well Woman with normal exam  Poorly controlled diabetes  She has intermittent candida intertrigo  Currently has a vulvitis  P:   No pap this year  She has the # to schedule the mammogram  Referral already placed for colonoscopy  Discussed breast self exam  Discussed calcium and vit D intake   Discussed vulvar skin care, information given  Affirm sent  Treat with steroid ointment

## 2018-02-22 NOTE — Patient Instructions (Signed)
EXERCISE AND DIET:  We recommended that you start or continue a regular exercise program for good health. Regular exercise means any activity that makes your heart beat faster and makes you sweat.  We recommend exercising at least 30 minutes per day at least 3 days a week, preferably 4 or 5.  We also recommend a diet low in fat and sugar.  Inactivity, poor dietary choices and obesity can cause diabetes, heart attack, stroke, and kidney damage, among others.    ALCOHOL AND SMOKING:  Women should limit their alcohol intake to no more than 7 drinks/beers/glasses of wine (combined, not each!) per week. Moderation of alcohol intake to this level decreases your risk of breast cancer and liver damage. And of course, no recreational drugs are part of a healthy lifestyle.  And absolutely no smoking or even second hand smoke. Most people know smoking can cause heart and lung diseases, but did you know it also contributes to weakening of your bones? Aging of your skin?  Yellowing of your teeth and nails?  CALCIUM AND VITAMIN D:  Adequate intake of calcium and Vitamin D are recommended.  The recommendations for exact amounts of these supplements seem to change often, but generally speaking 1,200 mg of calcium (between diet and supplement) and 800 units of Vitamin D per day seems prudent. Certain women may benefit from higher intake of Vitamin D.  If you are among these women, your doctor will have told you during your visit.    PAP SMEARS:  Pap smears, to check for cervical cancer or precancers,  have traditionally been done yearly, although recent scientific advances have shown that most women can have pap smears less often.  However, every woman still should have a physical exam from her gynecologist every year. It will include a breast check, inspection of the vulva and vagina to check for abnormal growths or skin changes, a visual exam of the cervix, and then an exam to evaluate the size and shape of the uterus and  ovaries.  And after 58 years of age, a rectal exam is indicated to check for rectal cancers. We will also provide age appropriate advice regarding health maintenance, like when you should have certain vaccines, screening for sexually transmitted diseases, bone density testing, colonoscopy, mammograms, etc.   MAMMOGRAMS:  All women over 40 years old should have a yearly mammogram. Many facilities now offer a "3D" mammogram, which may cost around $50 extra out of pocket. If possible,  we recommend you accept the option to have the 3D mammogram performed.  It both reduces the number of women who will be called back for extra views which then turn out to be normal, and it is better than the routine mammogram at detecting truly abnormal areas.    COLONOSCOPY:  Colonoscopy to screen for colon cancer is recommended for all women at age 50.  We know, you hate the idea of the prep.  We agree, BUT, having colon cancer and not knowing it is worse!!  Colon cancer so often starts as a polyp that can be seen and removed at colonscopy, which can quite literally save your life!  And if your first colonoscopy is normal and you have no family history of colon cancer, most women don't have to have it again for 10 years.  Once every ten years, you can do something that may end up saving your life, right?  We will be happy to help you get it scheduled when you are ready.    Be sure to check your insurance coverage so you understand how much it will cost.  It may be covered as a preventative service at no cost, but you should check your particular policy.      Breast Self-Awareness Breast self-awareness means being familiar with how your breasts look and feel. It involves checking your breasts regularly and reporting any changes to your health care provider. Practicing breast self-awareness is important. A change in your breasts can be a sign of a serious medical problem. Being familiar with how your breasts look and feel allows  you to find any problems early, when treatment is more likely to be successful. All women should practice breast self-awareness, including women who have had breast implants. How to do a breast self-exam One way to learn what is normal for your breasts and whether your breasts are changing is to do a breast self-exam. To do a breast self-exam: Look for Changes  1. Remove all the clothing above your waist. 2. Stand in front of a mirror in a room with good lighting. 3. Put your hands on your hips. 4. Push your hands firmly downward. 5. Compare your breasts in the mirror. Look for differences between them (asymmetry), such as: ? Differences in shape. ? Differences in size. ? Puckers, dips, and bumps in one breast and not the other. 6. Look at each breast for changes in your skin, such as: ? Redness. ? Scaly areas. 7. Look for changes in your nipples, such as: ? Discharge. ? Bleeding. ? Dimpling. ? Redness. ? A change in position. Feel for Changes  Carefully feel your breasts for lumps and changes. It is best to do this while lying on your back on the floor and again while sitting or standing in the shower or tub with soapy water on your skin. Feel each breast in the following way:  Place the arm on the side of the breast you are examining above your head.  Feel your breast with the other hand.  Start in the nipple area and make  inch (2 cm) overlapping circles to feel your breast. Use the pads of your three middle fingers to do this. Apply light pressure, then medium pressure, then firm pressure. The light pressure will allow you to feel the tissue closest to the skin. The medium pressure will allow you to feel the tissue that is a little deeper. The firm pressure will allow you to feel the tissue close to the ribs.  Continue the overlapping circles, moving downward over the breast until you feel your ribs below your breast.  Move one finger-width toward the center of the body.  Continue to use the  inch (2 cm) overlapping circles to feel your breast as you move slowly up toward your collarbone.  Continue the up and down exam using all three pressures until you reach your armpit.  Write Down What You Find  Write down what is normal for each breast and any changes that you find. Keep a written record with breast changes or normal findings for each breast. By writing this information down, you do not need to depend only on memory for size, tenderness, or location. Write down where you are in your menstrual cycle, if you are still menstruating. If you are having trouble noticing differences in your breasts, do not get discouraged. With time you will become more familiar with the variations in your breasts and more comfortable with the exam. How often should I examine my breasts? Examine   your breasts every month. If you are breastfeeding, the best time to examine your breasts is after a feeding or after using a breast pump. If you menstruate, the best time to examine your breasts is 5-7 days after your period is over. During your period, your breasts are lumpier, and it may be more difficult to notice changes. When should I see my health care provider? See your health care provider if you notice:  A change in shape or size of your breasts or nipples.  A change in the skin of your breast or nipples, such as a reddened or scaly area.  Unusual discharge from your nipples.  A lump or thick area that was not there before.  Pain in your breasts.  Anything that concerns you.  This information is not intended to replace advice given to you by your health care provider. Make sure you discuss any questions you have with your health care provider. Document Released: 04/19/2005 Document Revised: 09/25/2015 Document Reviewed: 03/09/2015 Elsevier Interactive Patient Education  2018 [employer]  

## 2018-02-23 LAB — VAGINITIS/VAGINOSIS, DNA PROBE
Candida Species: NEGATIVE
Gardnerella vaginalis: NEGATIVE
Trichomonas vaginosis: NEGATIVE

## 2019-02-21 ENCOUNTER — Other Ambulatory Visit: Payer: Self-pay

## 2019-02-21 DIAGNOSIS — Z20822 Contact with and (suspected) exposure to covid-19: Secondary | ICD-10-CM

## 2019-02-22 LAB — NOVEL CORONAVIRUS, NAA: SARS-CoV-2, NAA: NOT DETECTED

## 2019-04-06 ENCOUNTER — Telehealth: Payer: Self-pay | Admitting: Internal Medicine

## 2019-04-06 NOTE — Telephone Encounter (Signed)
rx refill metFORMIN (GLUCOPHAGE) 1000 MG tablet dapagliflozin propanediol (FARXIGA) 5 MG TABS tablet Lancets (ONETOUCH ULTRASOFT) lancets  Insulin Glargine [facility]) 100 UNIT/ML [facility]   PHARMACY [id number] [place], [place] [phone] (Phone) [phone] (Fax)

## 2019-04-06 NOTE — Telephone Encounter (Signed)
Can you call her and let her know she will need an appointment?

## 2019-04-09 NOTE — Telephone Encounter (Signed)
Appointment scheduled for 12/10.

## 2019-04-12 ENCOUNTER — Other Ambulatory Visit (INDEPENDENT_AMBULATORY_CARE_PROVIDER_SITE_OTHER): Payer: BC Managed Care – PPO

## 2019-04-12 ENCOUNTER — Ambulatory Visit (INDEPENDENT_AMBULATORY_CARE_PROVIDER_SITE_OTHER)
Admission: RE | Admit: 2019-04-12 | Discharge: 2019-04-12 | Disposition: A | Discharge status: Home / Self Care | Payer: BC Managed Care – PPO | Source: Ambulatory Visit | Attending: Internal Medicine | Admitting: Internal Medicine

## 2019-04-12 ENCOUNTER — Ambulatory Visit (INDEPENDENT_AMBULATORY_CARE_PROVIDER_SITE_OTHER): Payer: BC Managed Care – PPO | Admitting: Internal Medicine

## 2019-04-12 ENCOUNTER — Encounter: Payer: Self-pay | Admitting: Internal Medicine

## 2019-04-12 ENCOUNTER — Other Ambulatory Visit: Payer: Self-pay

## 2019-04-12 VITALS — BP 142/82 | HR 77 | Temp 97.8°F | Resp 16 | Ht 60.24 in | Wt 149.0 lb

## 2019-04-12 DIAGNOSIS — E785 Hyperlipidemia, unspecified: Secondary | ICD-10-CM

## 2019-04-12 DIAGNOSIS — I10 Essential (primary) hypertension: Secondary | ICD-10-CM | POA: Insufficient documentation

## 2019-04-12 DIAGNOSIS — Z1231 Encounter for screening mammogram for malignant neoplasm of breast: Secondary | ICD-10-CM | POA: Insufficient documentation

## 2019-04-12 DIAGNOSIS — E118 Type 2 diabetes mellitus with unspecified complications: Secondary | ICD-10-CM

## 2019-04-12 DIAGNOSIS — Z1211 Encounter for screening for malignant neoplasm of colon: Secondary | ICD-10-CM

## 2019-04-12 DIAGNOSIS — E119 Type 2 diabetes mellitus without complications: Secondary | ICD-10-CM | POA: Insufficient documentation

## 2019-04-12 DIAGNOSIS — M25511 Pain in right shoulder: Secondary | ICD-10-CM

## 2019-04-12 DIAGNOSIS — E11319 Type 2 diabetes mellitus with unspecified diabetic retinopathy without macular edema: Secondary | ICD-10-CM

## 2019-04-12 DIAGNOSIS — Z0000 Encounter for general adult medical examination without abnormal findings: Secondary | ICD-10-CM | POA: Diagnosis not present

## 2019-04-12 DIAGNOSIS — G8929 Other chronic pain: Secondary | ICD-10-CM | POA: Insufficient documentation

## 2019-04-12 DIAGNOSIS — E1139 Type 2 diabetes mellitus with other diabetic ophthalmic complication: Secondary | ICD-10-CM | POA: Diagnosis not present

## 2019-04-12 DIAGNOSIS — M542 Cervicalgia: Secondary | ICD-10-CM | POA: Diagnosis not present

## 2019-04-12 DIAGNOSIS — M19011 Primary osteoarthritis, right shoulder: Secondary | ICD-10-CM

## 2019-04-12 DIAGNOSIS — Z124 Encounter for screening for malignant neoplasm of cervix: Secondary | ICD-10-CM

## 2019-04-12 DIAGNOSIS — E559 Vitamin D deficiency, unspecified: Secondary | ICD-10-CM | POA: Insufficient documentation

## 2019-04-12 LAB — CBC WITH DIFFERENTIAL/PLATELET
Basophils Absolute: 0.1 10*3/uL (ref 0.0–0.1)
Basophils Relative: 0.8 % (ref 0.0–3.0)
Eosinophils Absolute: 0.1 10*3/uL (ref 0.0–0.7)
Eosinophils Relative: 0.8 % (ref 0.0–5.0)
HCT: 41.8 % (ref 36.0–46.0)
Hemoglobin: 14.1 g/dL (ref 12.0–15.0)
Lymphocytes Relative: 31.8 % (ref 12.0–46.0)
Lymphs Abs: 2.6 10*3/uL (ref 0.7–4.0)
MCHC: 33.6 g/dL (ref 30.0–36.0)
MCV: 84.5 fl (ref 78.0–100.0)
Monocytes Absolute: 0.4 10*3/uL (ref 0.1–1.0)
Monocytes Relative: 5 % (ref 3.0–12.0)
Neutro Abs: 5.1 10*3/uL (ref 1.4–7.7)
Neutrophils Relative %: 61.6 % (ref 43.0–77.0)
Platelets: 181 10*3/uL (ref 150.0–400.0)
RBC: 4.95 Mil/uL (ref 3.87–5.11)
RDW: 13.5 % (ref 11.5–15.5)
WBC: 8.3 10*3/uL (ref 4.0–10.5)

## 2019-04-12 LAB — MICROALBUMIN / CREATININE URINE RATIO
Creatinine,U: 79 mg/dL
Microalb Creat Ratio: 4.2 mg/g (ref 0.0–30.0)
Microalb, Ur: 3.3 mg/dL — ABNORMAL HIGH (ref 0.0–1.9)

## 2019-04-12 LAB — URINALYSIS, ROUTINE W REFLEX MICROSCOPIC
Bilirubin Urine: NEGATIVE
Hgb urine dipstick: NEGATIVE
Leukocytes,Ua: NEGATIVE
Nitrite: NEGATIVE
Specific Gravity, Urine: 1.025 (ref 1.000–1.030)
Total Protein, Urine: NEGATIVE
Urine Glucose: >=1000 — AB
Urobilinogen, UA: 0.2 (ref 0.0–1.0)
pH: 5 (ref 5.0–8.0)

## 2019-04-12 LAB — HEPATIC FUNCTION PANEL
ALT: 14 U/L (ref 0–35)
AST: 12 U/L (ref 0–37)
Albumin: 3.9 g/dL (ref 3.5–5.2)
Alkaline Phosphatase: 69 U/L (ref 39–117)
Bilirubin, Direct: 0.1 mg/dL (ref 0.0–0.3)
Total Bilirubin: 0.7 mg/dL (ref 0.2–1.2)
Total Protein: 7.5 g/dL (ref 6.0–8.3)

## 2019-04-12 LAB — LIPID PANEL
Cholesterol: 192 mg/dL (ref 0–200)
HDL: 45 mg/dL (ref >39.00)
LDL Cholesterol: 112 mg/dL — ABNORMAL HIGH (ref 0–99)
NonHDL: 146.89
Total CHOL/HDL Ratio: 4
Triglycerides: 172 mg/dL — ABNORMAL HIGH (ref 0.0–149.0)
VLDL: 34.4 mg/dL (ref 0.0–40.0)

## 2019-04-12 LAB — TSH: TSH: 1.01 u[IU]/mL (ref 0.35–4.50)

## 2019-04-12 LAB — BASIC METABOLIC PANEL
BUN: 15 mg/dL (ref 6–23)
CO2: 27 mEq/L (ref 19–32)
Calcium: 9 mg/dL (ref 8.4–10.5)
Chloride: 101 mEq/L (ref 96–112)
Creatinine, Ser: 0.66 mg/dL (ref 0.40–1.20)
GFR: 91.64 mL/min (ref >60.00)
Glucose, Bld: 258 mg/dL — ABNORMAL HIGH (ref 70–99)
Potassium: 4 mEq/L (ref 3.5–5.1)
Sodium: 137 mEq/L (ref 135–145)

## 2019-04-12 LAB — VITAMIN D 25 HYDROXY (VIT D DEFICIENCY, FRACTURES): VITD: 24.73 ng/mL — ABNORMAL LOW (ref 30.00–100.00)

## 2019-04-12 LAB — HEMOGLOBIN A1C: Hgb A1c MFr Bld: 12.9 % — ABNORMAL HIGH (ref 4.6–6.5)

## 2019-04-12 MED ORDER — INSULIN PEN NEEDLE 32G X 4 MM MISC: 1 refills | Status: DC

## 2019-04-12 MED ORDER — TRESIBA FLEXTOUCH 200 UNIT/ML ~~LOC~~ SOPN: 65.0000 [IU] | PEN_INJECTOR | Freq: Every day | SUBCUTANEOUS | 1 refills | Status: DC

## 2019-04-12 MED ORDER — INSULIN PEN NEEDLE 31G X 5 MM MISC: 1.0000 | Freq: Four times a day (QID) | 3 refills | Status: AC

## 2019-04-12 MED ORDER — ATORVASTATIN CALCIUM 20 MG PO TABS: 20.0000 mg | ORAL_TABLET | Freq: Every day | ORAL | 1 refills | Status: DC

## 2019-04-12 MED ORDER — METFORMIN HCL 1000 MG PO TABS: 1000.0000 mg | ORAL_TABLET | Freq: Two times a day (BID) | ORAL | 1 refills | Status: DC

## 2019-04-12 MED ORDER — HUMALOG KWIKPEN 200 UNIT/ML ~~LOC~~ SOPN: 5.0000 [IU] | PEN_INJECTOR | Freq: Three times a day (TID) | SUBCUTANEOUS | 1 refills | Status: DC

## 2019-04-12 MED ORDER — CHOLECALCIFEROL 50 MCG (2000 UT) PO TABS: 1.0000 | ORAL_TABLET | Freq: Every day | ORAL | 1 refills | Status: AC

## 2019-04-12 MED ORDER — IRBESARTAN 150 MG PO TABS: 150.0000 mg | ORAL_TABLET | Freq: Every day | ORAL | 1 refills | Status: DC

## 2019-04-12 MED ORDER — ONETOUCH VERIO VI STRP: ORAL_STRIP | 3 refills | Status: DC
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
## 2019-04-12 NOTE — Patient Instructions (Signed)
Health Maintenance, Female Adopting a healthy lifestyle and getting preventive care are important in promoting health and wellness. Ask your health care provider about:  The right schedule for you to have regular tests and exams.  Things you can do on your own to prevent diseases and keep yourself healthy. What should I know about diet, weight, and exercise? Eat a healthy diet   Eat a diet that includes plenty of vegetables, fruits, low-fat dairy products, and lean protein.  Do not eat a lot of foods that are high in solid fats, added sugars, or sodium. Maintain a healthy weight Body mass index (BMI) is used to identify weight problems. It estimates body fat based on height and weight. Your health care provider can help determine your BMI and help you achieve or maintain a healthy weight. Get regular exercise Get regular exercise. This is one of the most important things you can do for your health. Most adults should:  Exercise for at least 150 minutes each week. The exercise should increase your heart rate and make you sweat (moderate-intensity exercise).  Do strengthening exercises at least twice a week. This is in addition to the moderate-intensity exercise.  Spend less time sitting. Even light physical activity can be beneficial. Watch cholesterol and blood lipids Have your blood tested for lipids and cholesterol at 59 years of age, then have this test every 5 years. Have your cholesterol levels checked more often if:  Your lipid or cholesterol levels are high.  You are older than 59 years of age.  You are at high risk for heart disease. What should I know about cancer screening? Depending on your health history and family history, you may need to have cancer screening at various ages. This may include screening for:  Breast cancer.  Cervical cancer.  Colorectal cancer.  Skin cancer.  Lung cancer. What should I know about heart disease, diabetes, and high blood  pressure? Blood pressure and heart disease  High blood pressure causes heart disease and increases the risk of stroke. This is more likely to develop in people who have high blood pressure readings, are of African descent, or are overweight.  Have your blood pressure checked: ? Every 3-5 years if you are 18-39 years of age. ? Every year if you are 40 years old or older. Diabetes Have regular diabetes screenings. This checks your fasting blood sugar level. Have the screening done:  Once every three years after age 40 if you are at a normal weight and have a low risk for diabetes.  More often and at a younger age if you are overweight or have a high risk for diabetes. What should I know about preventing infection? Hepatitis B If you have a higher risk for hepatitis B, you should be screened for this virus. Talk with your health care provider to find out if you are at risk for hepatitis B infection. Hepatitis C Testing is recommended for:  Everyone born from 1945 through 1965.  Anyone with known risk factors for hepatitis C. Sexually transmitted infections (STIs)  Get screened for STIs, including gonorrhea and chlamydia, if: ? You are sexually active and are younger than 59 years of age. ? You are older than 59 years of age and your health care provider tells you that you are at risk for this type of infection. ? Your sexual activity has changed since you were last screened, and you are at increased risk for chlamydia or gonorrhea. Ask your health care provider if   you are at risk.  Ask your health care provider about whether you are at high risk for HIV. Your health care provider may recommend a prescription medicine to help prevent HIV infection. If you choose to take medicine to prevent HIV, you should first get tested for HIV. You should then be tested every 3 months for as long as you are taking the medicine. Pregnancy  If you are about to stop having your period (premenopausal) and  you may become pregnant, seek counseling before you get pregnant.  Take 400 to 800 micrograms (mcg) of folic acid every day if you become pregnant.  Ask for birth control (contraception) if you want to prevent pregnancy. Osteoporosis and menopause Osteoporosis is a disease in which the bones lose minerals and strength with aging. This can result in bone fractures. If you are 65 years old or older, or if you are at risk for osteoporosis and fractures, ask your health care provider if you should:  Be screened for bone loss.  Take a calcium or vitamin D supplement to lower your risk of fractures.  Be given hormone replacement therapy (HRT) to treat symptoms of menopause. Follow these instructions at home: Lifestyle  Do not use any products that contain nicotine or tobacco, such as cigarettes, e-cigarettes, and chewing tobacco. If you need help quitting, ask your health care provider.  Do not use street drugs.  Do not share needles.  Ask your health care provider for help if you need support or information about quitting drugs. Alcohol use  Do not drink alcohol if: ? Your health care provider tells you not to drink. ? You are pregnant, may be pregnant, or are planning to become pregnant.  If you drink alcohol: ? Limit how much you use to 0-1 drink a day. ? Limit intake if you are breastfeeding.  Be aware of how much alcohol is in your drink. In the [place] one drink equals one 12 oz bottle of beer (355 mL), one 5 oz glass of wine (148 mL), or one 1 oz glass of hard liquor (44 mL). General instructions  Schedule regular health, dental, and eye exams.  Stay current with your vaccines.  Tell your health care provider if: ? You often feel depressed. ? You have ever been abused or do not feel safe at home. Summary  Adopting a healthy lifestyle and getting preventive care are important in promoting health and wellness.  Follow your health care provider's instructions about healthy  diet, exercising, and getting tested or screened for diseases.  Follow your health care provider's instructions on monitoring your cholesterol and blood pressure. This information is not intended to replace advice given to you by your health care provider. Make sure you discuss any questions you have with your health care provider. Document Released: 11/02/2010 Document Revised: 04/12/2018 Document Reviewed: 04/12/2018 Elsevier Patient Education  2020 [employer]  

## 2019-04-13 ENCOUNTER — Encounter: Payer: Self-pay | Admitting: Internal Medicine

## 2019-04-13 DIAGNOSIS — H2513 Age-related nuclear cataract, bilateral: Secondary | ICD-10-CM | POA: Diagnosis not present

## 2019-04-13 DIAGNOSIS — M19011 Primary osteoarthritis, right shoulder: Secondary | ICD-10-CM | POA: Insufficient documentation

## 2019-04-13 DIAGNOSIS — H25013 Cortical age-related cataract, bilateral: Secondary | ICD-10-CM | POA: Diagnosis not present

## 2019-04-13 DIAGNOSIS — E113393 Type 2 diabetes mellitus with moderate nonproliferative diabetic retinopathy without macular edema, bilateral: Secondary | ICD-10-CM | POA: Diagnosis not present

## 2019-04-13 DIAGNOSIS — H524 Presbyopia: Secondary | ICD-10-CM | POA: Diagnosis not present

## 2019-04-13 DIAGNOSIS — H35033 Hypertensive retinopathy, bilateral: Secondary | ICD-10-CM | POA: Diagnosis not present

## 2019-05-28 ENCOUNTER — Encounter: Payer: Self-pay | Admitting: Internal Medicine

## 2019-06-18 ENCOUNTER — Encounter: Payer: Self-pay | Admitting: Internal Medicine

## 2019-06-26 ENCOUNTER — Encounter: Payer: Self-pay | Admitting: Registered"

## 2019-06-26 ENCOUNTER — Other Ambulatory Visit: Payer: Self-pay

## 2019-06-26 ENCOUNTER — Encounter: Payer: BC Managed Care – PPO | Attending: Internal Medicine | Admitting: Registered"

## 2019-06-26 DIAGNOSIS — E119 Type 2 diabetes mellitus without complications: Secondary | ICD-10-CM | POA: Diagnosis not present

## 2019-06-26 NOTE — Progress Notes (Signed)
Diabetes Self-Management Education  Visit Type: First/Initial  Appt. Start Time: 3:00p Appt. End Time: 4:15p  06/26/2019  [name], identified by name and date of birth, is a 59 y.o. female with a diagnosis of Diabetes: Type 2.   ASSESSMENT  Last menstrual period 05/03/2000. There is no height or weight on file to calculate BMI.    Sleep: She reports she gets 5 hours total of sleep, that she wakes multiple times during the night to use the bathroom or take care of a new pet. She states she was diagnosed with OSA 15 yrs ago but has never used a CPAP.   Pt reports multiple daily servings sugar-sweetened beverages. Pt also has a 30 yrs nightly habit of eating Devil Dog (has trans fat) and milk. Pt reports she plans to begin using Herbalife shakes for weight loss. Pt reports that there is a significant family history of diabetes and hypertension.   Pt reports she does not understand why her recent A1c of 12% over the last 3 years when she has not changed her diet of lifestyle significantly.  Next RD plans to check in with patient to see if she has started taking her lunch to work as she mentioned was a good idea during the visit.  Diabetes Self-Management Education - 06/26/19 1459      Visit Information   Visit Type  First/Initial      Initial Visit   Diabetes Type  Type 2    Are you currently following a meal plan?  No    Are you taking your medications as prescribed?  Yes   basal, meal insulin, metformin   Date Diagnosed  10 yrs ago      Health Coping   How would you rate your overall health?  Fair      Psychosocial Assessment   Patient Belief/Attitude about Diabetes  Afraid    How often do you need to have someone help you when you read instructions, pamphlets, or other written materials from your doctor or pharmacy?  1 - Never    What is the last grade level you completed in school?  1st year college      Complications   Last HgB A1C per patient/outside source  12  %    How often do you check your blood sugar?  1-2 times/day    Fasting Blood glucose range (mg/dL)  >595   >638   Postprandial Blood glucose range (mg/dL)  >756   433 mg/dL   Have you had a dilated eye exam in the past 12 months?  Yes    Have you had a dental exam in the past 12 months?  No      Dietary Intake   Breakfast  oatmeal OR cream of wheat OR croissant or bagel    Snack (morning)  none    Lunch  fruit OR fast food; french fries, plain chicken or fish, 20 oz pepsi    Snack (afternoon)  none    Dinner  chicken or pork chop, rice or pasta or potato, broccoli or corn or string beans, lemonade no-sugar or regular soda    Snack (evening)  cream cake, milk    Beverage(s)  coffee (2% milk 1 Tbs sugar)      Exercise   Exercise Type  Light (walking / raking leaves)    How many days per week to you exercise?  3    How many minutes per day do you exercise?  25  Total minutes per week of exercise  75      Patient Education   Previous Diabetes Education  No    Disease state   Definition of diabetes, type 1 and 2, and the diagnosis of diabetes    Nutrition management   Role of diet in the treatment of diabetes and the relationship between the three main macronutrients and blood glucose level;Carbohydrate counting    Physical activity and exercise   Role of exercise on diabetes management, blood pressure control and cardiac health.    Medications  Reviewed patients medication for diabetes, action, purpose, timing of dose and side effects.    Monitoring  Identified appropriate SMBG and/or A1C goals.    Psychosocial adjustment  Role of stress on diabetes;Other (comment)   sleep apnea     Individualized Goals (developed by patient)   Nutrition  General guidelines for healthy choices and portions discussed    Physical Activity  Exercise 3-5 times per week      Outcomes   Expected Outcomes  Demonstrated interest in learning. Expect positive outcomes    Future DMSE  4-6 wks    Program  Status  Completed      Stress: not assessed  Individualized Plan for Diabetes Self-Management Training:   Learning Objective:  Patient will have a greater understanding of diabetes self-management. Patient education plan is to attend individual and/or group sessions per assessed needs and concerns.  Patient Instructions  Consider talking to your PCP about your history of sleep apnea Consider drinking less soda, consider trying selzer water with some flavoring adding. Aim for 3 Carb Choices per meal (30 grams) +/- 1 either way  Aim for 0-1 Carbs choices (0-15 grams) per snack if hungry  Include protein with your meals and snacks Consider reading food labels for Total Carbohydrate of foods Consider  increasing your activity by 2-3 days per week as tolerated Continue checking blood sugar at alternate times per day  Continue taking medication as directed by MD   Expected Outcomes:  Demonstrated interest in learning. Expect positive outcomes  Education material provided: ADA - How to Thrive: A Guide for Your Journey with Diabetes, Snack sheet and Carbohydrate counting sheet, Nutrition in the [place]  If problems or questions, patient to contact team via:  Phone and MyChart  Future DSME appointment: 4-6 wks

## 2019-06-26 NOTE — Patient Instructions (Addendum)
Consider talking to your PCP about your history of sleep apnea Consider drinking less soda, consider trying selzer water with some flavoring adding. Aim for 3 Carb Choices per meal (30 grams) +/- 1 either way  Aim for 0-1 Carbs choices (0-15 grams) per snack if hungry  Include protein with your meals and snacks Consider reading food labels for Total Carbohydrate of foods Consider  increasing your activity by 2-3 days per week as tolerated Continue checking blood sugar at alternate times per day  Continue taking medication as directed by MD

## 2019-08-07 ENCOUNTER — Ambulatory Visit: Payer: BC Managed Care – PPO | Admitting: Registered"

## 2019-10-03 ENCOUNTER — Other Ambulatory Visit: Payer: Self-pay | Admitting: Internal Medicine

## 2019-10-03 DIAGNOSIS — E118 Type 2 diabetes mellitus with unspecified complications: Secondary | ICD-10-CM

## 2019-10-03 DIAGNOSIS — E559 Vitamin D deficiency, unspecified: Secondary | ICD-10-CM

## 2019-10-03 DIAGNOSIS — I10 Essential (primary) hypertension: Secondary | ICD-10-CM

## 2019-10-30 ENCOUNTER — Other Ambulatory Visit: Payer: Self-pay

## 2019-10-30 ENCOUNTER — Encounter: Payer: Self-pay | Admitting: Endocrinology

## 2019-10-30 ENCOUNTER — Ambulatory Visit (INDEPENDENT_AMBULATORY_CARE_PROVIDER_SITE_OTHER): Payer: BC Managed Care – PPO | Admitting: Endocrinology

## 2019-10-30 VITALS — BP 158/88 | HR 90 | Ht 60.24 in | Wt 154.4 lb

## 2019-10-30 DIAGNOSIS — E118 Type 2 diabetes mellitus with unspecified complications: Secondary | ICD-10-CM | POA: Diagnosis not present

## 2019-10-30 DIAGNOSIS — E1139 Type 2 diabetes mellitus with other diabetic ophthalmic complication: Secondary | ICD-10-CM | POA: Diagnosis not present

## 2019-10-30 LAB — POCT GLYCOSYLATED HEMOGLOBIN (HGB A1C): Hemoglobin A1C: 11.8 % — AB (ref 4.0–5.6)

## 2019-10-30 MED ORDER — TRESIBA FLEXTOUCH 200 UNIT/ML ~~LOC~~ SOPN: 80.0000 [IU] | PEN_INJECTOR | Freq: Every day | SUBCUTANEOUS | 3 refills | Status: DC

## 2019-10-30 NOTE — Progress Notes (Signed)
Subjective:    Patient ID: Sharon Kane, female    DOB: 1960/05/23, 59 y.o.   MRN: [id number]  HPI pt is referred by Dr Ronnald Ramp, for diabetes.  Pt states DM was dx'ed in 4680; it is complicated by PN and DR; she has been on insulin since 2020; pt says her diet and exercise are fair; she has never had GDM, pancreatitis, pancreatic surgery, severe hypoglycemia or DKA.  She takes 2 insulins and metformin.  She says cbg varies from 167-300.  Pt says she does not miss meds, but she takes humalog just qd.    Past Medical History:  Diagnosis Date  . Diabetes mellitus without complication ([place])   . Hyperlipidemia     Past Surgical History:  Procedure Laterality Date  . TUBAL LIGATION      Social History   Socioeconomic History  . Marital status: Married    Spouse name: Not on file  . Number of children: Not on file  . Years of education: Not on file  . Highest education level: Not on file  Occupational History  . Not on file  Tobacco Use  . Smoking status: Never Smoker  . Smokeless tobacco: Never Used  Vaping Use  . Vaping Use: Never used  Substance and Sexual Activity  . Alcohol use: No  . Drug use: No  . Sexual activity: Yes    Partners: Male    Birth control/protection: Post-menopausal  Other Topics Concern  . Not on file  Social History Narrative  . Not on file   Social Determinants of Health   Financial Resource Strain:   . Difficulty of Paying Living Expenses:   Food Insecurity:   . Worried About Charity fundraiser in the Last Year:   . Arboriculturist in the Last Year:   Transportation Needs:   . Film/video editor (Medical):   Marland Kitchen Lack of Transportation (Non-Medical):   Physical Activity:   . Days of Exercise per Week:   . Minutes of Exercise per Session:   Stress:   . Feeling of Stress :   Social Connections:   . Frequency of Communication with Friends and Family:   . Frequency of Social Gatherings with Friends and Family:   . Attends Religious  Services:   . Active Member of Clubs or Organizations:   . Attends Archivist Meetings:   Marland Kitchen Marital Status:   Intimate Partner Violence:   . Fear of Current or Ex-Partner:   . Emotionally Abused:   Marland Kitchen Physically Abused:   . Sexually Abused:     Current Outpatient Medications on File Prior to Visit  Medication Sig Dispense Refill  . atorvastatin (LIPITOR) 20 MG tablet Take 1 tablet (20 mg total) by mouth daily. 90 tablet 1  . Blood Glucose Monitoring Suppl (ONETOUCH VERIO IQ SYSTEM) w/Device KIT 1 Act by Does not apply route 2 (two) times daily. (Patient taking differently: 1 each by Does not apply route 2 (two) times daily. E11.9) 2 kit 0  . Cholecalciferol 50 MCG (2000 UT) TABS Take 1 tablet (2,000 Units total) by mouth daily. 90 tablet 1  . glucose blood (ONETOUCH VERIO) test strip Dx: E11.8 Test up to BID (Patient taking differently: 1 each by Other route 2 (two) times daily. E11.9) 200 each 3  . Insulin Pen Needle 31G X 5 MM MISC 1 Act by Does not apply route 4 (four) times daily. Use to help administer lantus insulin (Patient taking differently: 1  each by Does not apply route 4 (four) times daily. E11.9) 300 each 3  . irbesartan (AVAPRO) 150 MG tablet Take 1 tablet (150 mg total) by mouth daily. 90 tablet 1  . Lancets (ONETOUCH ULTRASOFT) lancets 1 each by Other route 2 (two) times daily. Use to help check blood sugars twice a day Dx E11.8 (Patient taking differently: 1 each by Other route 2 (two) times daily. E11.9) 180 each 3  . metFORMIN (GLUCOPHAGE) 1000 MG tablet Take 1 tablet (1,000 mg total) by mouth 2 (two) times daily with a meal. 180 tablet 1  . [DISCONTINUED] Insulin Glargine (BASAGLAR KWIKPEN) 100 UNIT/ML SOPN INJECT 0.3 MLS (30 UNITS TOTAL) INTO THE SKIN DAILY AT 10 PM. 15 mL 1   No current facility-administered medications on file prior to visit.    No Known Allergies  Family History  Problem Relation Age of Onset  . Hypertension Mother   . Aneurysm Father    . Early death Father   . Diabetes Maternal Aunt   . Cancer Neg Hx   . COPD Neg Hx   . Depression Neg Hx   . Alcohol abuse Neg Hx   . Drug abuse Neg Hx   . Heart disease Neg Hx   . Hyperlipidemia Neg Hx   . Kidney disease Neg Hx     BP (!) 158/88   Pulse 90   Ht 5' 0.24" (1.53 m)   Wt 154 lb 6.4 oz (70 kg)   LMP 05/03/2000   SpO2 96%   BMI 29.91 kg/m   Review of Systems Denies blurry vision, chest pain, sob, n/v, urinary frequency, memory loss, and depression.  She has gained a few lbs.       Objective:   Physical Exam VS: see vs page GEN: no distress HEAD: head: no deformity eyes: no periorbital swelling, no proptosis external nose and ears are normal NECK: supple, thyroid is not enlarged CHEST WALL: no deformity LUNGS: clear to auscultation CV: reg rate and rhythm, no murmur MUSCULOSKELETAL: muscle bulk and strength are grossly normal.  no obvious joint swelling.  gait is normal and steady EXTEMITIES: no deformity.  no ulcer on the feet.  feet are of normal color and temp.  no edema PULSES: dorsalis pedis intact bilat.  no carotid bruit NEURO:  cn 2-12 grossly intact.   readily moves all 4's.  sensation is intact to touch on the feet SKIN:  Normal texture and temperature.  No rash or suspicious lesion is visible.   NODES:  None palpable at the neck PSYCH: alert, well-oriented.  Does not appear anxious nor depressed.   Lab Results  Component Value Date   CREATININE 0.66 04/12/2019   BUN 15 04/12/2019   NA 137 04/12/2019   K 4.0 04/12/2019   CL 101 04/12/2019   CO2 27 04/12/2019   Lab Results  Component Value Date   HGBA1C 11.8 (A) 10/30/2019   I have reviewed outside records, and summarized: Pt was noted to have severely elevated A1c, and referred here.  Pt was also seen by RD, ad diet was not good.      Assessment & Plan:  Insulin-requiring type 2 DM, with DR, new to me: we discussed.  Severe exacerbation.  She wants to take insulin just qd.  She  declines to add another med. HTN: is noted today  Patient Instructions  Your blood pressure is high today.  Please see your primary care provider soon, to have it rechecked good diet and  exercise significantly improve the control of your diabetes.  please let me know if you wish to be referred to a dietician.  high blood sugar is very risky to your health.  you should see an eye doctor and dentist every year.  It is very important to get all recommended vaccinations.  Controlling your blood pressure and cholesterol drastically reduces the damage diabetes does to your body.  Those who smoke should quit.  Please discuss these with your doctor.  check your blood sugar twice a day.  vary the time of day when you check, between before the 3 meals, and at bedtime.  also check if you have symptoms of your blood sugar being too high or too low.  please keep a record of the readings and bring it to your next appointment here (or you can bring the meter itself).  You can write it on any piece of paper.  please call us sooner if your blood sugar goes below 70, or if you have a lot of readings over 200.   For now, please:  Increase the Tresiba to 80 units daily, and: Stop taking the humalog, and: Please continue the same metformin.   Please come back for a follow-up appointment in 2-3 weeks.

## 2019-10-30 NOTE — Patient Instructions (Addendum)
Your blood pressure is high today.  Please see your primary care provider soon, to have it rechecked good diet and exercise significantly improve the control of your diabetes.  please let me know if you wish to be referred to a dietician.  high blood sugar is very risky to your health.  you should see an eye doctor and dentist every year.  It is very important to get all recommended vaccinations.  Controlling your blood pressure and cholesterol drastically reduces the damage diabetes does to your body.  Those who smoke should quit.  Please discuss these with your doctor.  check your blood sugar twice a day.  vary the time of day when you check, between before the 3 meals, and at bedtime.  also check if you have symptoms of your blood sugar being too high or too low.  please keep a record of the readings and bring it to your next appointment here (or you can bring the meter itself).  You can write it on any piece of paper.  please call us sooner if your blood sugar goes below 70, or if you have a lot of readings over 200.   For now, please:  Increase the Tresiba to 80 units daily, and: Stop taking the humalog, and: Please continue the same metformin.   Please come back for a follow-up appointment in 2-3 weeks.

## 2019-11-30 ENCOUNTER — Ambulatory Visit: Payer: BC Managed Care – PPO | Admitting: Endocrinology

## 2020-09-25 ENCOUNTER — Other Ambulatory Visit: Payer: Self-pay

## 2020-09-25 ENCOUNTER — Encounter (HOSPITAL_COMMUNITY): Payer: Self-pay

## 2020-09-25 ENCOUNTER — Ambulatory Visit (HOSPITAL_COMMUNITY)
Admission: EM | Admit: 2020-09-25 | Discharge: 2020-09-25 | Disposition: A | Discharge status: Home / Self Care | Payer: BC Managed Care – PPO | Attending: Physician Assistant | Admitting: Physician Assistant

## 2020-09-25 DIAGNOSIS — W57XXXA Bitten or stung by nonvenomous insect and other nonvenomous arthropods, initial encounter: Secondary | ICD-10-CM | POA: Diagnosis not present

## 2020-09-25 DIAGNOSIS — L299 Pruritus, unspecified: Secondary | ICD-10-CM

## 2020-09-25 DIAGNOSIS — S90862A Insect bite (nonvenomous), left foot, initial encounter: Secondary | ICD-10-CM | POA: Diagnosis not present

## 2020-09-25 DIAGNOSIS — M7989 Other specified soft tissue disorders: Secondary | ICD-10-CM | POA: Diagnosis not present

## 2020-09-25 MED ORDER — HYDROXYZINE HCL 25 MG PO TABS: 25.0000 mg | ORAL_TABLET | Freq: Four times a day (QID) | ORAL | 0 refills | Status: DC

## 2020-09-25 MED ORDER — TRIAMCINOLONE ACETONIDE 0.1 % EX CREA
1.0000 | TOPICAL_CREAM | Freq: Two times a day (BID) | CUTANEOUS | 0 refills | Status: DC
Start: 2020-09-25 — End: 2021-03-17

## 2020-09-25 NOTE — Discharge Instructions (Signed)
No signs of infection. No obvious signs of fractures. Hydroxyzine for itching. Triamcinolone on the insect bite. Ice compress to leg. Ace wrap, elevation for leg swelling.  Monitor for spreading redness, warmth, pain, follow up for reevaluation

## 2020-09-25 NOTE — ED Provider Notes (Signed)
[facility]    CSN: [id number] Arrival date & time: 09/25/20  1946      History   Chief Complaint Chief Complaint  Patient presents with  . Insect Bite    HPI Sharon Kane is a 60 y.o. female.   60 year old female with history of IDDM comes in for 2 day history of left leg swelling. States yesterday, worked in the yard and stepped in a hole. Denies any pain, and able to walk without difficulty. However, has had itching to the leg since then with swelling, and wonders about insect bite. States she worked standing the whole day, and causing swelling to be worse.     Past Medical History:  Diagnosis Date  . Diabetes mellitus without complication ([place])   . Hyperlipidemia     Patient Active Problem List   Diagnosis Date Noted  . Primary osteoarthritis, right shoulder 04/13/2019  . Essential hypertension 04/12/2019  . Visit for screening mammogram 04/12/2019  . Neck pain on right side 04/12/2019  . Chronic right shoulder pain 04/12/2019  . Diabetes mellitus without complication ([place]) 33/82/5053  . Vitamin D deficiency 04/12/2019  . Diabetic retinopathy without macular edema associated with type 2 diabetes mellitus ([place]) 10/20/2016  . Screening for cervical cancer 09/22/2016  . Colon cancer screening 10/02/2014  . 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 Hyperlipidemia with target LDL less than 100 01/18/2013  . Routine general medical examination at a health care facility 01/18/2013    Past Surgical History:  Procedure Laterality Date  . TUBAL LIGATION      OB History    Gravida  3   Para  3   Term  3   Preterm      AB      Living  3     SAB      IAB      Ectopic      Multiple      Live Births  3            Home Medications    Prior to Admission medications   Medication Sig Start Date End Date Taking? Authorizing Provider  hydrOXYzine (ATARAX/VISTARIL) 25 MG tablet  Take 1 tablet (25 mg total) by mouth every 6 (six) hours. 09/25/20  Yes Dewon Mendizabal V, PA-C  triamcinolone cream (KENALOG) 0.1 % Apply 1 application topically 2 (two) times daily. 09/25/20  Yes Kierah Goatley V, PA-C  atorvastatin (LIPITOR) 20 MG tablet Take 1 tablet (20 mg total) by mouth daily. 04/12/19   Janith Lima, MD  Blood Glucose Monitoring Suppl (ONETOUCH VERIO IQ SYSTEM) w/Device KIT 1 Act by Does not apply route 2 (two) times daily. Patient taking differently: 1 each by Does not apply route 2 (two) times daily. E11.9 11/16/17   Janith Lima, MD  Cholecalciferol 50 MCG (2000 UT) TABS Take 1 tablet (2,000 Units total) by mouth daily. 04/12/19   Janith Lima, MD  glucose blood (ONETOUCH VERIO) test strip Dx: E11.8 Test up to BID Patient taking differently: 1 each by Other route 2 (two) times daily. E11.9 04/12/19   Janith Lima, MD  insulin degludec (TRESIBA FLEXTOUCH) 200 UNIT/ML FlexTouch Pen Inject 80 Units into the skin daily. 10/30/19   Renato Shin, MD  Insulin Pen Needle 31G X 5 MM MISC 1 Act by Does not apply route 4 (four) times daily. Use to help administer lantus insulin Patient taking differently: 1  each by Does not apply route 4 (four) times daily. E11.9 04/12/19   Janith Lima, MD  irbesartan (AVAPRO) 150 MG tablet Take 1 tablet (150 mg total) by mouth daily. 04/12/19   Janith Lima, MD  Lancets [facility] ULTRASOFT) lancets 1 each by Other route 2 (two) times daily. Use to help check blood sugars twice a day Dx E11.8 Patient taking differently: 1 each by Other route 2 (two) times daily. E11.9 11/16/17   Janith Lima, MD  metFORMIN (GLUCOPHAGE) 1000 MG tablet Take 1 tablet (1,000 mg total) by mouth 2 (two) times daily with a meal. 04/12/19   Janith Lima, MD  Insulin Glargine (BASAGLAR KWIKPEN) 100 UNIT/ML SOPN INJECT 0.3 MLS (30 UNITS TOTAL) INTO THE SKIN DAILY AT 10 PM. 02/15/18 04/12/19  Janith Lima, MD    Family History Family History  Problem Relation Age  of Onset  . Hypertension Mother   . Aneurysm Father   . Early death Father   . Diabetes Maternal Aunt   . Cancer Neg Hx   . COPD Neg Hx   . Depression Neg Hx   . Alcohol abuse Neg Hx   . Drug abuse Neg Hx   . Heart disease Neg Hx   . Hyperlipidemia Neg Hx   . Kidney disease Neg Hx     Social History Social History   Tobacco Use  . Smoking status: Never Smoker  . Smokeless tobacco: Never Used  Vaping Use  . Vaping Use: Never used  Substance Use Topics  . Alcohol use: No  . Drug use: No     Allergies   Patient has no known allergies.   Review of Systems Review of Systems  Reason unable to perform ROS: See HPI as above.     Physical Exam Triage Vital Signs ED Triage Vitals  Enc Vitals Group     BP 09/25/20 2019 (!) 155/79     Pulse Rate 09/25/20 2017 83     Resp 09/25/20 2017 18     Temp 09/25/20 2017 98.1 F (36.7 C)     Temp src --      SpO2 09/25/20 2017 99 %     Weight --      Height --      Head Circumference --      Peak Flow --      Pain Score 09/25/20 2016 0     Pain Loc --      Pain Edu? --      Excl. in [place]? --    No data found.  Updated Vital Signs BP (!) 155/79   Pulse 83   Temp 98.1 F (36.7 C)   Resp 18   LMP 05/03/2000   SpO2 99%   Physical Exam Constitutional:      General: She is not in acute distress.    Appearance: Normal appearance. She is well-developed. She is not toxic-appearing or diaphoretic.  HENT:     Head: Normocephalic and atraumatic.  Eyes:     Conjunctiva/sclera: Conjunctivae normal.     Pupils: Pupils are equal, round, and reactive to light.  Pulmonary:     Effort: Pulmonary effort is normal. No respiratory distress.  Musculoskeletal:     Cervical back: Normal range of motion and neck supple.     Comments: Swelling to the left lower leg without erythema, warmth, induration. Compartments soft. Nontender to palpation.   Small vesicular rash to the dorsal foot, ?insect bite.  Skin:    General: Skin is  warm and dry.  Neurological:     Mental Status: She is alert and oriented to person, place, and time.      UC Treatments / Results  Labs (all labs ordered are listed, but only abnormal results are displayed) Labs Reviewed - No data to display  EKG   Radiology No results found.  Procedures Procedures (including critical care time)  Medications Ordered in UC Medications - No data to display  Initial Impression / Assessment and Plan / UC Course  I have reviewed the triage vital signs and the nursing notes.  Pertinent labs & imaging results that were available during my care of the patient were reviewed by me and considered in my medical decision making (see chart for details).    No alarming signs on exam.  No signs of infection.  Current history and exam with no indications for x-ray.  Patient with uncontrolled IDDM, will avoid oral prednisone.  Will treat for itching with hydroxyzine.  Triamcinolone on insect bite.  Ice compress, elevation, compression wrap for swelling.  Return precautions given.  Final Clinical Impressions(s) / UC Diagnoses   Final diagnoses:  Itching  Leg swelling  Insect bite of left foot, initial encounter    ED Prescriptions    Medication Sig Dispense Auth. Provider   hydrOXYzine (ATARAX/VISTARIL) 25 MG tablet Take 1 tablet (25 mg total) by mouth every 6 (six) hours. 24 tablet Masie Bermingham V, PA-C   triamcinolone cream (KENALOG) 0.1 % Apply 1 application topically 2 (two) times daily. 30 g Ok Edwards, PA-C     PDMP not reviewed this encounter.   Ok Edwards, PA-C 09/25/20 2124

## 2020-09-25 NOTE — ED Triage Notes (Signed)
Pt presents with left leg swelling and redness x 2 days. Pt states she is not sure if she was bitten by an insect yesterday while doing her yard. Pt states it is difficult to bend her toes.

## 2021-03-03 IMAGING — DX DG CERVICAL SPINE COMPLETE 4+V
5 series · 5 of 5 positions shown · non-contrast
Comparison: None

CLINICAL DATA: Chronic neck and right shoulder and elbow pain for
2-3 months.

EXAM:
CERVICAL SPINE - COMPLETE 4+ VIEW

[c-spine lat]
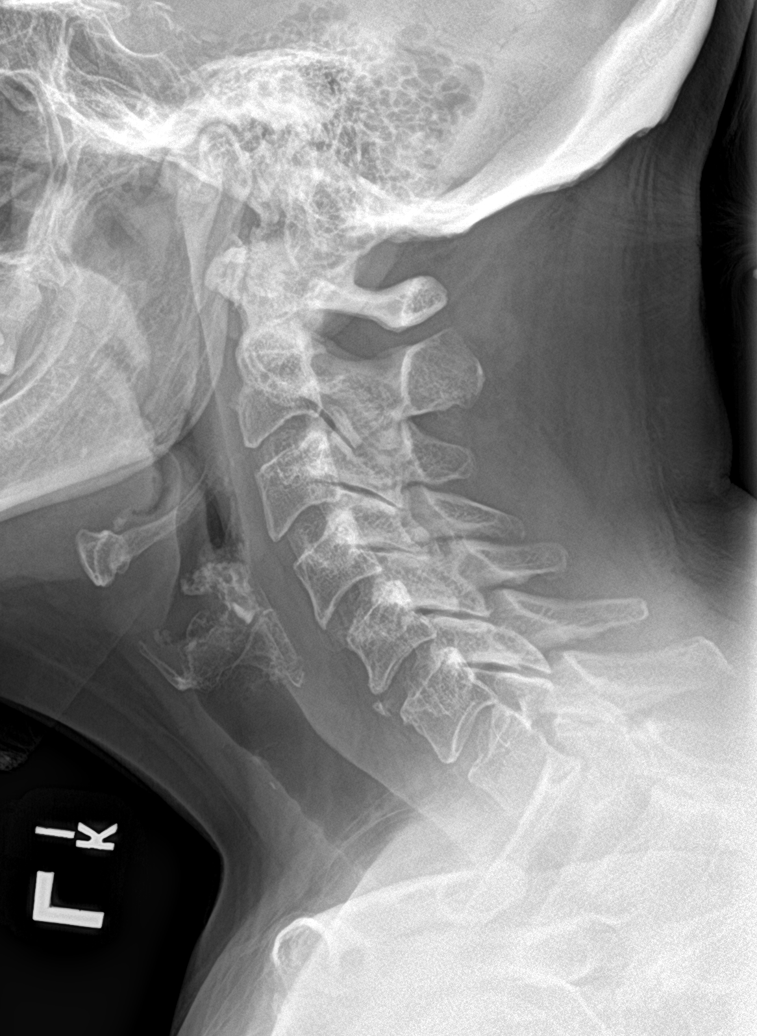

[c-spine obl (1 of 2)]
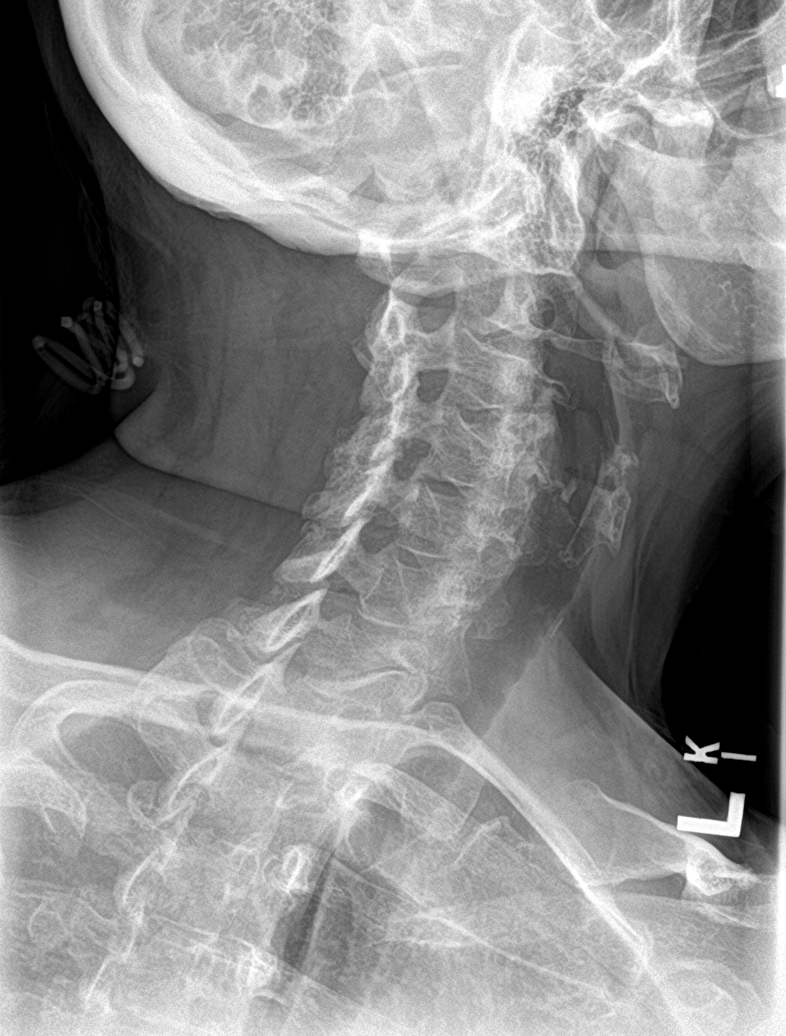

[c-spine obl (2 of 2)]
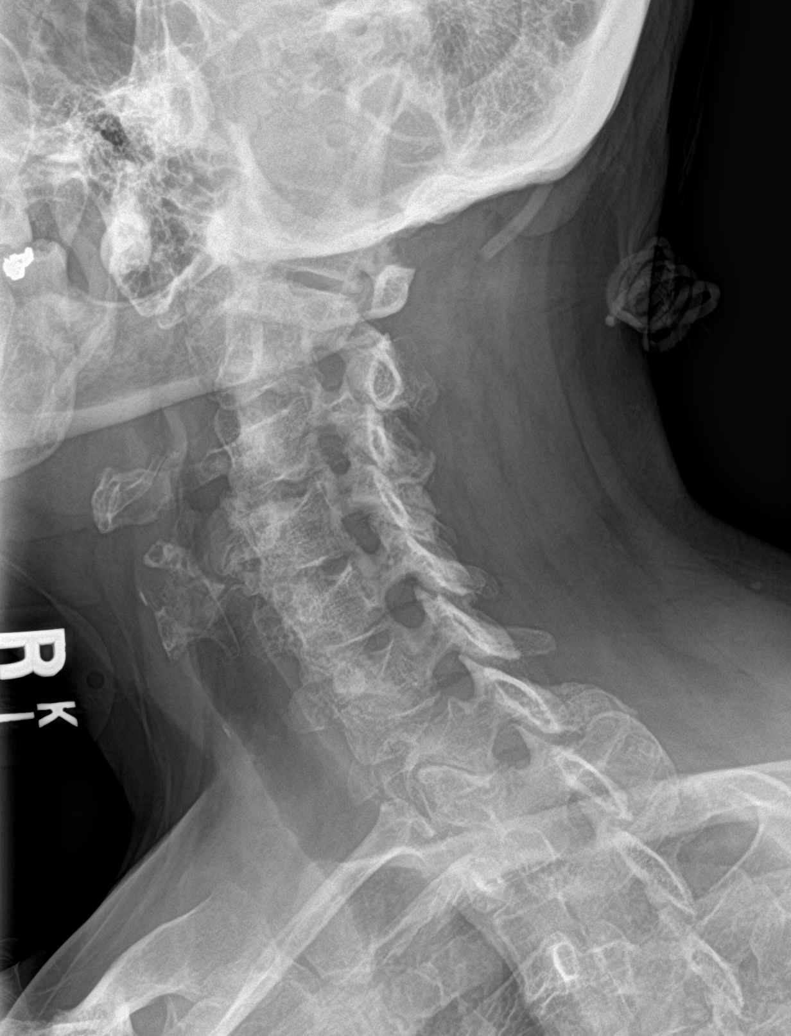

[c-spine ap]
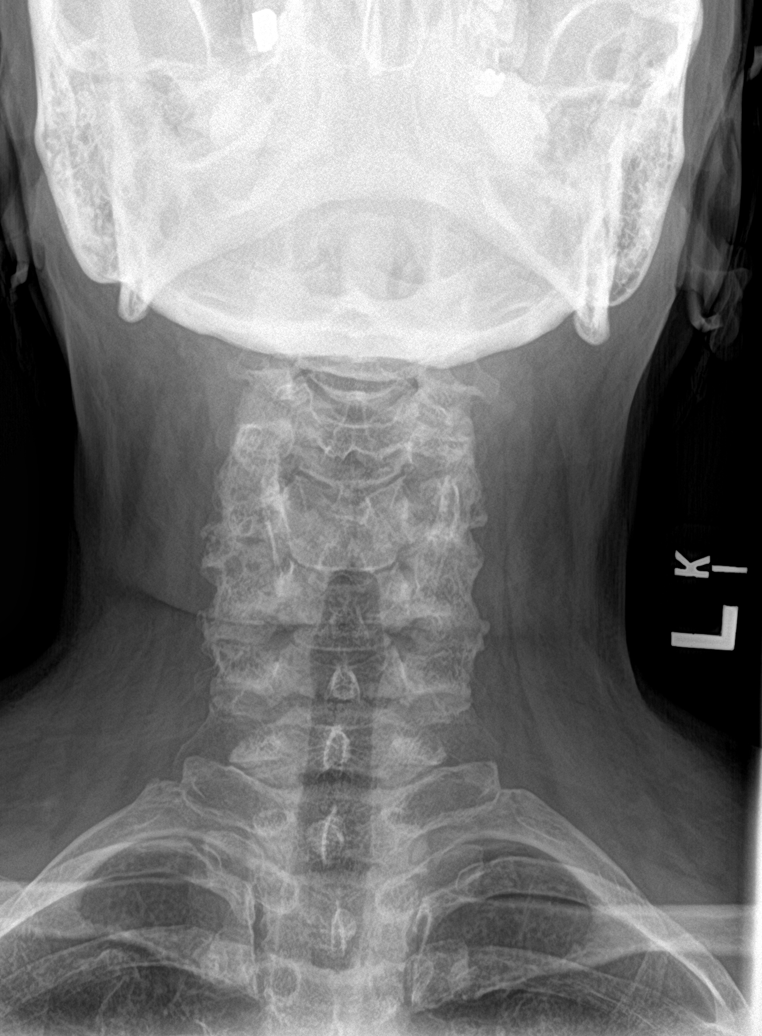

[c-spine open mouth]
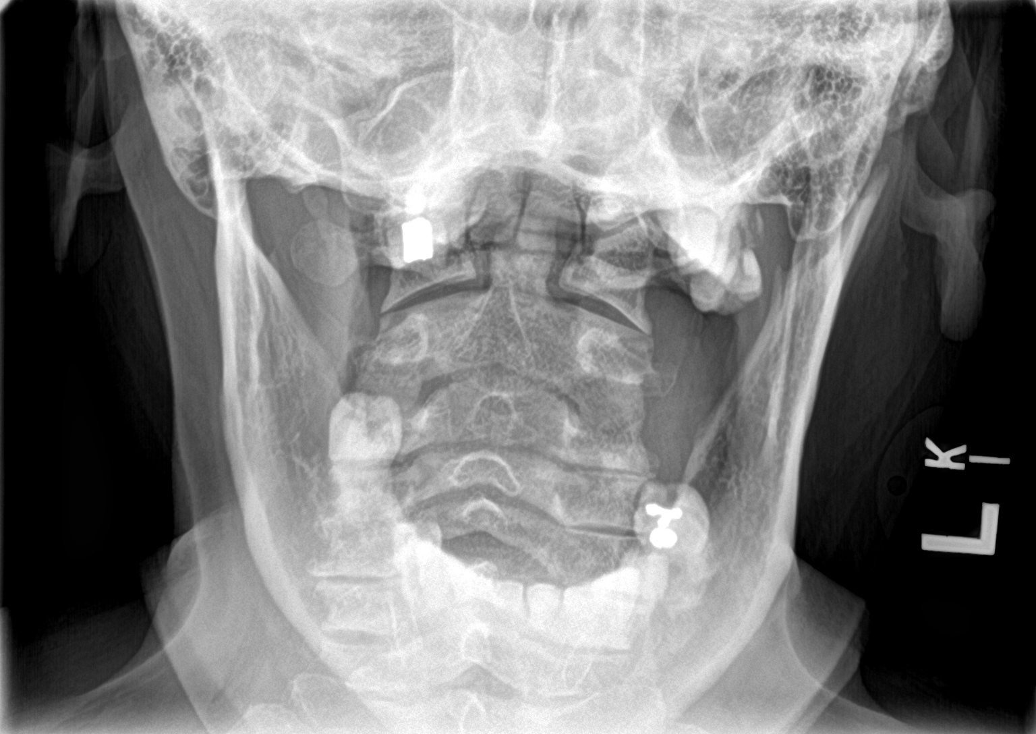

[5 of 5 positions shown; findings below may reference images not displayed]

FINDINGS: The alignment of the cervical spine appears normal. The vertebral
body heights are well preserved. No fracture or subluxation
identified. No radio-opaque foreign body or soft tissue
calcification. Patent neural foramina bilaterally.
IMPRESSION: Normal appearance of the cervical spine.

## 2021-03-17 ENCOUNTER — Encounter: Payer: Self-pay | Admitting: Family Medicine

## 2021-03-17 ENCOUNTER — Telehealth (INDEPENDENT_AMBULATORY_CARE_PROVIDER_SITE_OTHER): Payer: BC Managed Care – PPO | Admitting: Family Medicine

## 2021-03-17 VITALS — Wt 148.0 lb

## 2021-03-17 DIAGNOSIS — U071 COVID-19: Secondary | ICD-10-CM

## 2021-03-17 MED ORDER — MOLNUPIRAVIR EUA 200MG CAPSULE: 4.0000 | ORAL_CAPSULE | Freq: Two times a day (BID) | ORAL | 0 refills | Status: AC

## 2021-03-17 NOTE — Patient Instructions (Addendum)
---------------------------------------------------------------------------------------------------------------------------    WORK SLIP:  Patient Sharon Kane,  [birth year], was seen for a medical visit today, 03/17/21 . Please excuse from work for a COVID/flu like illness. If Covid19 testing is positive advise 10 days minimum from the onset of symptoms (03/14/21) PLUS 1 day of no fever and improved symptoms. Will defer to employer for a sooner return to work if patient has 2 negative covid tests 48 hours apart and is feeling better, or if symptoms have resolved, it is greater than 5 days since the positive test and the patient can wear a high-quality, tight fitting mask such as N95 or KN95 at all times for an additional 5 days. Would also suggest COVID19 antigen testing is negative prior to early return from a Covid illness.  Sincerely: E-signature: Dr. Colin Benton, DO [place] Ph: [phone]   ------------------------------------------------------------------------------------------------------------------------------   HOME CARE TIPS:  -COVID19 testing information: [email or web address]  Most pharmacies also offer testing and home test kits. If the Covid19 test is positive and you desire antiviral treatment, please contact a [facility] or schedule a follow up virtual visit through your primary care office or through the [facility].  Other test to treat options: [email or web address]  -I sent the medication(s) we discussed to your pharmacy: Meds ordered this encounter  Medications   molnupiravir EUA (LAGEVRIO) 200 mg CAPS capsule    Sig: Take 4 capsules (800 mg total) by mouth 2 (two) times daily for 5 days.    Dispense:  40 capsule    Refill:  0     -I sent in the [place] treatment or referral you requested per our discussion. Please see the information provided  below and discuss further with the pharmacist/treatment team.  -there is a chance of rebound illness after finishing your treatment. If you become sick again please isolate for an additional 5 days, plus 5 more days of masking.   -can use tylenol if needed for fevers, aches and pains per instructions  -can use nasal saline a few times per day if you have nasal congestion; sometimes  a short course of Afrin nasal spray for 3 days can help with symptoms as well  -stay hydrated, drink plenty of fluids and eat small healthy meals - avoid dairy  -can take 1000 IU (27mg) Vit D3 and 100-500 mg of Vit C daily per instructions  -If the Covid test is positive, check out the [facility] for more information on home care, transmission and treatment for COVID19  -follow up with your doctor in 2-3 days unless improving and feeling better  -stay home while sick, except to seek medical care. If you have COVID19, ideally it would be best to stay home for a full 10 days since the onset of symptoms PLUS one day of no fever and feeling better. Wear a good mask that fits snugly (such as N95 or KN95) if around others to reduce the risk of transmission.  It was nice to meet you today, and I really hope you are feeling better soon. I help [facility] out with telemedicine visits on Tuesdays and Thursdays and am available for visits on those days. If you have any concerns or questions following this visit please schedule a follow up visit with your Primary Care doctor or seek care at a local urgent care clinic to avoid delays in care.    Seek in person care or schedule a follow up video visit promptly if your symptoms worsen, new  concerns arise or you are not improving with treatment. Call 911 and/or seek emergency care if your symptoms are severe or life threatening.       Fact Sheet for Patients And Caregivers Emergency Use Authorization (EUA) Of LAGEVRIOT (molnupiravir) capsules For Coronavirus Disease 2019  (COVID-19)  What is the most important information I should know about LAGEVRIO? LAGEVRIO may cause serious side effects, including: ? LAGEVRIO may cause harm to your unborn baby. It is not known if LAGEVRIO will harm your baby if you take LAGEVRIO during pregnancy. o LAGEVRIO is not recommended for use in pregnancy. o LAGEVRIO has not been studied in pregnancy. LAGEVRIO was studied in pregnant animals only. When LAGEVRIO was given to pregnant animals, LAGEVRIO caused harm to their unborn babies. o You and your healthcare provider may decide that you should take LAGEVRIO during pregnancy if there are no other COVID-19 treatment options approved or authorized by the FDA that are accessible or clinically appropriate for you. o If you and your healthcare provider decide that you should take LAGEVRIO during pregnancy, you and your healthcare provider should discuss the known and potential benefits and the potential risks of taking LAGEVRIO during pregnancy. For individuals who are able to become pregnant: ? You should use a reliable method of birth control (contraception) consistently and correctly during treatment with LAGEVRIO and for 4 days after the last dose of LAGEVRIO. Talk to your healthcare provider about reliable birth control methods. ? Before starting treatment with [facility] your healthcare provider may do a pregnancy test to see if you are pregnant before starting treatment with LAGEVRIO. ? Tell your healthcare provider right away if you become pregnant or think you may be pregnant during treatment with LAGEVRIO. Pregnancy Surveillance Program: ? There is a pregnancy surveillance program for individuals who take LAGEVRIO during pregnancy. The purpose of this program is to collect information about the health of you and your baby. Talk to your healthcare provider about how to take part in this program. ? If you take LAGEVRIO during pregnancy and you agree to participate in the  pregnancy surveillance program and allow your healthcare provider to share your information with [place], then your healthcare provider will report your use of Ranger during pregnancy to [facility]. by calling [phone] or [email or web address]. For individuals who are sexually active with partners who are able to become pregnant: ? It is not known if LAGEVRIO can affect sperm. While the risk is regarded as low, animal studies to fully assess the potential for LAGEVRIO to affect the babies of males treated with LAGEVRIO have not been completed. A reliable method of birth control (contraception) should be used consistently and correctly during treatment with LAGEVRIO and for at least 3 months after the last dose. The risk to sperm beyond 3 months is not known. Studies to understand the risk to sperm beyond 3 months are ongoing. Talk to your healthcare provider about reliable birth control methods. Talk to your healthcare provider if you have questions or concerns about how LAGEVRIO may affect sperm. You are being given this fact sheet because your healthcare provider believes it is necessary to provide you with LAGEVRIO for the treatment of adults with mild-to-moderate coronavirus disease 2019 (COVID-19) with positive results of direct SARS-CoV-2 viral testing, and who are at high risk for progression to severe COVID-19 including hospitalization or death, and for whom other COVID-19 treatment options approved or authorized by the FDA are not accessible or clinically appropriate.  The U.S. Food and Drug Administration (FDA) has issued an Emergency Use Authorization (EUA) to make LAGEVRIO available during the COVID-19 pandemic (for more details about an EUA please see "What is an Emergency Use Authorization?" at the end of this document). LAGEVRIO is not an FDA-approved medicine in the [place]. Read this Fact Sheet for information about LAGEVRIO. Talk  to your healthcare provider about your options if you have any questions. It is your choice to take LAGEVRIO.  What is COVID-19? COVID-19 is caused by a virus called a coronavirus. You can get COVID-19 through close contact with another person who has the virus. COVID-19 illnesses have ranged from very mild-to-severe, including illness resulting in death. While information so far suggests that most COVID-19 illness is mild, serious illness can happen and may cause some of your other medical conditions to become worse. Older people and people of all ages with severe, long lasting (chronic) medical conditions like heart disease, lung disease and diabetes, for example seem to be at higher risk of being hospitalized for COVID-19.  What is LAGEVRIO? LAGEVRIO is an investigational medicine used to treat mild-to-moderate COVID-19 in adults: ? with positive results of direct SARS-CoV-2 viral testing, and ? who are at high risk for progression to severe COVID-19 including hospitalization or death, and for whom other COVID-19 treatment options approved or authorized by the FDA are not accessible or clinically appropriate. The FDA has authorized the emergency use of LAGEVRIO for the treatment of mild-tomoderate COVID-19 in adults under an EUA. For more information on EUA, see the "What is an Emergency Use Authorization (EUA)?" section at the end of this Fact Sheet. LAGEVRIO is not authorized: ? for use in people less than 60 years of age. ? for prevention of COVID-19. ? for people needing hospitalization for COVID-19. ? for use for longer than 5 consecutive days.  What should I tell my healthcare provider before I take LAGEVRIO? Tell your healthcare provider if you: ? Have any allergies ? Are breastfeeding or plan to breastfeed ? Have any serious illnesses ? Are taking any medicines (prescription, over-the-counter, vitamins, or herbal products).  How do I take LAGEVRIO? ? Take LAGEVRIO  exactly as your healthcare provider tells you to take it. ? Take 4 capsules of LAGEVRIO every 12 hours (for example, at 8 am and at 8 pm) ? Take LAGEVRIO for 5 days. It is important that you complete the full 5 days of treatment with LAGEVRIO. Do not stop taking LAGEVRIO before you complete the full 5 days of treatment, even if you feel better. ? Take LAGEVRIO with or without food. ? You should stay in isolation for as long as your healthcare provider tells you to. Talk to your healthcare provider if you are not sure about how to properly isolate while you have COVID-19. ? Swallow LAGEVRIO capsules whole. Do not open, break, or crush the capsules. If you cannot swallow capsules whole, tell your healthcare provider. ? What to do if you miss a dose: o If it has been less than 10 hours since the missed dose, take it as soon as you remember o If it has been more than 10 hours since the missed dose, skip the missed dose and take your dose at the next scheduled time. ? Do not double the dose of LAGEVRIO to make up for a missed dose.  What are the important possible side effects of LAGEVRIO? ? See, "What is the most important information I should know about LAGEVRIO?" ?  Allergic Reactions. Allergic reactions can happen in people taking LAGEVRIO, even after only 1 dose. Stop taking LAGEVRIO and call your healthcare provider right away if you get any of the following symptoms of an allergic reaction: o hives o rapid heartbeat o trouble swallowing or breathing o swelling of the mouth, lips, or face o throat tightness o hoarseness o skin rash The most common side effects of LAGEVRIO are: ? diarrhea ? nausea ? dizziness These are not all the possible side effects of LAGEVRIO. Not many people have taken LAGEVRIO. Serious and unexpected side effects may happen. This medicine is still being studied, so it is possible that all of the risks are not known at this time.  What other treatment  choices are there?  Veklury (remdesivir) is FDA-approved as an intravenous (IV) infusion for the treatment of mildto-moderate KGURK-27 in certain adults and children. Talk with your doctor to see if Marijean Heath is appropriate for you. Like LAGEVRIO, FDA may also allow for the emergency use of other medicines to treat people with COVID-19. Go to LacrosseProperties.si for more information. It is your choice to be treated or not to be treated with LAGEVRIO. Should you decide not to take it, it will not change your standard medical care.  What if I am breastfeeding? Breastfeeding is not recommended during treatment with LAGEVRIO and for 4 days after the last dose of LAGEVRIO. If you are breastfeeding or plan to breastfeed, talk to your healthcare provider about your options and specific situation before taking LAGEVRIO.  How do I report side effects with LAGEVRIO? Contact your healthcare provider if you have any side effects that bother you or do not go away. Report side effects to FDA MedWatch at SmoothHits.hu or call 1-800-FDA-1088 (1- 734-673-1431).  How should I store [place]? ? Store LAGEVRIO capsules at room temperature between 45F to 63F (20C to 25C). ? Keep LAGEVRIO and all medicines out of the reach of children and pets. How can I learn more about COVID-19? ? Ask your healthcare provider. ? Visit SeekRooms.co.uk ? Contact your local or state public health department. ? Call [place] at [phone] (toll free in the [place] ? Visit www.molnupiravir.com  What Is an Emergency Use Authorization (EUA)? The [place] FDA has made [place] available under an emergency access mechanism called an Emergency Use Authorization (EUA) The EUA is supported by a Presenter, broadcasting Health and Human Service (HHS) declaration that circumstances exist to justify emergency use  of drugs and biological products during the COVID-19 pandemic. LAGEVRIO for the treatment of mild-to-moderate COVID-19 in adults with positive results of direct SARS-CoV-2 viral testing, who are at high risk for progression to severe COVID-19, including hospitalization or death, and for whom alternative COVID-19 treatment options approved or authorized by FDA are not accessible or clinically appropriate, has not undergone the same type of review as an FDA-approved product. In issuing an EUA under the VVOHY-07 public health emergency, the FDA has determined, among other things, that based on the total amount of scientific evidence available including data from adequate and well-controlled clinical trials, if available, it is reasonable to believe that the product may be effective for diagnosing, treating, or preventing COVID-19, or a serious or life-threatening disease or condition caused by COVID-19; that the known and potential benefits of the product, when used to diagnose, treat, or prevent such disease or condition, outweigh the known and potential risks of such product; and that there are no adequate, approved, and available alternatives.  All  of these criteria must be met to allow for the product to be used in the treatment of patients during the COVID-19 pandemic. The EUA for LAGEVRIO is in effect for the duration of the COVID-19 declaration justifying emergency use of LAGEVRIO, unless terminated or revoked (after which LAGEVRIO may no longer be used under the EUA). For patent information: http://rogers.info/ Copyright  2021-2022 Register., [place], [place] and its affiliates. All rights reserved. [id number] Revised: March 2022

## 2021-03-17 NOTE — Progress Notes (Signed)
Virtual Visit via Video Note  I connected with Sharon Kane  on 03/17/21 at  3:40 PM EST by a video enabled telemedicine application and verified that I am speaking with the correct person using two identifiers.  Location patient: home,  Location provider:work or home office Persons participating in the virtual visit: patient, provider  I discussed the limitations of evaluation and management by telemedicine and the availability of in person appointments. The patient expressed understanding and agreed to proceed.   HPI:  Acute telemedicine visit for Covid19: -Onset: 3 days ago -tested positive for covid19  -Symptoms include: headache, nasal congestion, cough, diarrhea -Denies: CP, SOB, nausea, vomiting, inability to eat/drink/get out of bed -Pertinent past medical history: see below -Pertinent medication allergies: No Known Allergies -COVID-19 vaccine status: Immunization History  Administered Date(s) Administered   Moderna Sars-Covid-2 Vaccination 05/01/2020   PFIZER(Purple Top)SARS-COV-2 Vaccination 07/15/2019, 08/06/2019   Pneumococcal Polysaccharide-23 01/18/2013   Tdap 01/18/2013, 06/11/2013  -no recent labs and prefers to not get labs ROS: See pertinent positives and negatives per HPI.  Past Medical History:  Diagnosis Date   Diabetes mellitus without complication ([place])    Hyperlipidemia     Past Surgical History:  Procedure Laterality Date   TUBAL LIGATION       Current Outpatient Medications:    atorvastatin (LIPITOR) 20 MG tablet, Take 20 mg by mouth daily., Disp: , Rfl:    Blood Glucose Monitoring Suppl (ONETOUCH VERIO IQ SYSTEM) w/Device KIT, 1 Act by Does not apply route 2 (two) times daily. (Patient taking differently: 1 each by Does not apply route 2 (two) times daily. E11.9), Disp: 2 kit, Rfl: 0   Cholecalciferol 50 MCG (2000 UT) TABS, Take 1 tablet (2,000 Units total) by mouth daily., Disp: 90 tablet, Rfl: 1   glucose blood (ONETOUCH VERIO) test strip, Dx:  E11.8 Test up to BID (Patient taking differently: 1 each by Other route 2 (two) times daily. E11.9), Disp: 200 each, Rfl: 3   insulin degludec (TRESIBA FLEXTOUCH) 200 UNIT/ML FlexTouch Pen, Inject 80 Units into the skin daily., Disp: 12 pen, Rfl: 3   Insulin Pen Needle 31G X 5 MM MISC, 1 Act by Does not apply route 4 (four) times daily. Use to help administer lantus insulin (Patient taking differently: 1 each by Does not apply route 4 (four) times daily. E11.9), Disp: 300 each, Rfl: 3   Lancets (ONETOUCH ULTRASOFT) lancets, 1 each by Other route 2 (two) times daily. Use to help check blood sugars twice a day Dx E11.8 (Patient taking differently: 1 each by Other route 2 (two) times daily. E11.9), Disp: 180 each, Rfl: 3   molnupiravir EUA (LAGEVRIO) 200 mg CAPS capsule, Take 4 capsules (800 mg total) by mouth 2 (two) times daily for 5 days., Disp: 40 capsule, Rfl: 0  EXAM:  VITALS per patient if applicable:  GENERAL: alert, oriented, appears well and in no acute distress  HEENT: atraumatic, conjunttiva clear, no obvious abnormalities on inspection of external nose and ears  NECK: normal movements of the head and neck  LUNGS: on inspection no signs of respiratory distress, breathing rate appears normal, no obvious gross SOB, gasping or wheezing  CV: no obvious cyanosis  MS: moves all visible extremities without noticeable abnormality  PSYCH/NEURO: pleasant and cooperative, no obvious depression or anxiety, speech and thought processing grossly intact  ASSESSMENT AND PLAN:  Discussed the following assessment and plan:  COVID-19   Discussed treatment options, ideal treatment window, potential complications, isolation and precautions for COVID-19.  Discussed possibility of rebound with antivirals and the need to reisolate if it should occur for 5 days. Checked for/reviewed any labs done in the last 90 days with GFR listed in HPI if available.  After lengthy discussion, the patient opted for  treatment with Legevrio due to being higher risk for complications of covid or severe disease and other factors. Discussed EUA status of this drug and the fact that there is preliminary limited knowledge of risks/interactions/side effects per EUA document vs possible benefits and precautions. This information was shared with patient during the visit and also was provided in patient instructions. .  Other symptomatic care measures summarized in patient instructions. Work/School slipped offered: provided in patient instructions  Advised to seek prompt in person care if worsening, new symptoms arise, or if is not improving with treatment. Discussed options for inperson care if PCP office not available. Did let this patient know that I only do telemedicine on Tuesdays and Thursdays for [place]. Advised to schedule follow up visit with PCP or [facility] if any further questions or concerns to avoid delays in care.   I discussed the assessment and treatment plan with the patient. The patient was provided an opportunity to ask questions and all were answered. The patient agreed with the plan and demonstrated an understanding of the instructions.     Lucretia Kern, DO

## 2021-07-31 ENCOUNTER — Ambulatory Visit
Admission: EM | Admit: 2021-07-31 | Discharge: 2021-07-31 | Disposition: A | Discharge status: Home / Self Care | Payer: BC Managed Care – PPO | Attending: Physician Assistant | Admitting: Physician Assistant

## 2021-07-31 DIAGNOSIS — E11319 Type 2 diabetes mellitus with unspecified diabetic retinopathy without macular edema: Secondary | ICD-10-CM

## 2021-07-31 DIAGNOSIS — M25512 Pain in left shoulder: Secondary | ICD-10-CM

## 2021-07-31 MED ORDER — PREDNISONE 20 MG PO TABS: 40.0000 mg | ORAL_TABLET | Freq: Every day | ORAL | 0 refills | Status: AC

## 2021-07-31 NOTE — ED Provider Notes (Signed)
?[facility] ? ? ? ?CSN: [id number] ?Arrival date & time: 07/31/21  1726 ? ? ?  ? ?History   ?Chief Complaint ?Chief Complaint  ?Patient presents with  ? Shoulder Pain  ? ? ?HPI ?Cacey Curstin Schmale is a 61 y.o. female.  ? ?Patient here today for evaluation of left shoulder pain she has experienced the last few days. She has not had any injury but does note that pain started the day after she had been doing some yardwork. She reports movement of left arm worsens pain. She denies any numbness or tingling. She has tried OTC meds without significant relief. She has known arthritis in her right shoulder.  ? ?The history is provided by the patient.  ?Shoulder Pain ?Associated symptoms: no fever   ? ?Past Medical History:  ?Diagnosis Date  ? Diabetes mellitus without complication ([place])   ? Hyperlipidemia   ? ? ?Patient Active Problem List  ? Diagnosis Date Noted  ? Primary osteoarthritis, right shoulder 04/13/2019  ? Essential hypertension 04/12/2019  ? Visit for screening mammogram 04/12/2019  ? Neck pain on right side 04/12/2019  ? Chronic right shoulder pain 04/12/2019  ? Diabetes mellitus without complication ([place]) 32/35/5732  ? Vitamin D deficiency 04/12/2019  ? Diabetic retinopathy without macular edema associated with type 2 diabetes mellitus ([place]) 10/20/2016  ? Screening for cervical cancer 09/22/2016  ? Colon cancer screening 10/02/2014  ? 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 Hyperlipidemia with target LDL less than 100 01/18/2013  ? Routine general medical examination at a health care facility 01/18/2013  ? ? ?Past Surgical History:  ?Procedure Laterality Date  ? TUBAL LIGATION    ? ? ?OB History   ? ? Gravida  ?3  ? Para  ?3  ? Term  ?3  ? Preterm  ?   ? AB  ?   ? Living  ?3  ?  ? ? SAB  ?   ? IAB  ?   ? Ectopic  ?   ? Multiple  ?   ? Live Births  ?3  ?   ?  ?  ? ? ? ?Home Medications   ? ?Prior to Admission medications   ?Medication Sig Start  Date End Date Taking? Authorizing Provider  ?predniSONE (DELTASONE) 20 MG tablet Take 2 tablets (40 mg total) by mouth daily with breakfast for 5 days. 07/31/21 08/05/21 Yes Francene Finders, PA-C  ?atorvastatin (LIPITOR) 20 MG tablet Take 20 mg by mouth daily.    [provider]  ?Blood Glucose Monitoring Suppl (ONETOUCH VERIO IQ SYSTEM) w/Device KIT 1 Act by Does not apply route 2 (two) times daily. ?Patient taking differently: 1 each by Does not apply route 2 (two) times daily. E11.9 11/16/17   Janith Lima, MD  ?Cholecalciferol 50 MCG (2000 UT) TABS Take 1 tablet (2,000 Units total) by mouth daily. 04/12/19   Janith Lima, MD  ?glucose blood (ONETOUCH VERIO) test strip Dx: E11.8 Test up to BID ?Patient taking differently: 1 each by Other route 2 (two) times daily. E11.9 04/12/19   Janith Lima, MD  ?insulin degludec (TRESIBA FLEXTOUCH) 200 UNIT/ML FlexTouch Pen Inject 80 Units into the skin daily. 10/30/19   Renato Shin, MD  ?Insulin Pen Needle 31G X 5 MM MISC 1 Act by Does not apply route 4 (four) times daily. Use to help administer lantus insulin ?Patient taking differently: 1 each by Does not apply route  4 (four) times daily. E11.9 04/12/19   Janith Lima, MD  ?Lancets [facility] ULTRASOFT) lancets 1 each by Other route 2 (two) times daily. Use to help check blood sugars twice a day ?Dx E11.8 ?Patient taking differently: 1 each by Other route 2 (two) times daily. E11.9 11/16/17   Janith Lima, MD  ?Insulin Glargine [facility] KWIKPEN) 100 UNIT/ML SOPN INJECT 0.3 MLS (30 UNITS TOTAL) INTO THE SKIN DAILY AT 10 PM. 02/15/18 04/12/19  Janith Lima, MD  ? ? ?Family History ?Family History  ?Problem Relation Age of Onset  ? Hypertension Mother   ? Aneurysm Father   ? Early death Father   ? Diabetes Maternal Aunt   ? Cancer Neg Hx   ? COPD Neg Hx   ? Depression Neg Hx   ? Alcohol abuse Neg Hx   ? Drug abuse Neg Hx   ? Heart disease Neg Hx   ? Hyperlipidemia Neg Hx   ? Kidney disease Neg Hx    ? ? ?Social History ?Social History  ? ?Tobacco Use  ? Smoking status: Never  ? Smokeless tobacco: Never  ?Vaping Use  ? Vaping Use: Never used  ?Substance Use Topics  ? Alcohol use: No  ? Drug use: No  ? ? ? ?Allergies   ?Patient has no known allergies. ? ? ?Review of Systems ?Review of Systems  ?Constitutional:  Negative for chills and fever.  ?Eyes:  Negative for discharge and redness.  ?Respiratory:  Negative for shortness of breath.   ?Cardiovascular:  Negative for chest pain.  ?Musculoskeletal:  Positive for arthralgias. Negative for joint swelling.  ?Neurological:  Negative for numbness.  ? ? ?Physical Exam ?Triage Vital Signs ?ED Triage Vitals  ?Enc Vitals Group  ?   BP 07/31/21 1746 (!) 177/99  ?   Pulse Rate 07/31/21 1746 94  ?   Resp 07/31/21 1746 17  ?   Temp 07/31/21 1746 98.4 ?F (36.9 ?C)  ?   Temp Source 07/31/21 1746 Oral  ?   SpO2 07/31/21 1746 99 %  ?   Weight --   ?   Height --   ?   Head Circumference --   ?   Peak Flow --   ?   Pain Score 07/31/21 1750 8  ?   Pain Loc --   ?   Pain Edu? --   ?   Excl. in [place]? --   ? ?No data found. ? ?Updated Vital Signs ?BP (!) 177/99 (BP Location: Right Arm)   Pulse 94   Temp 98.4 ?F (36.9 ?C) (Oral)   Resp 17   LMP 05/03/2000   SpO2 99%  ?   ? ?Physical Exam ?Vitals and nursing note reviewed.  ?Constitutional:   ?   General: She is not in acute distress. ?   Appearance: Normal appearance. She is not ill-appearing.  ?HENT:  ?   Head: Normocephalic and atraumatic.  ?Eyes:  ?   Conjunctiva/sclera: Conjunctivae normal.  ?Cardiovascular:  ?   Rate and Rhythm: Normal rate.  ?Pulmonary:  ?   Effort: Pulmonary effort is normal.  ?Musculoskeletal:  ?   Comments: TTP noted at left [facility] joint, decreased ROM of left shoulder in all directions due to pain.   ?Neurological:  ?   Mental Status: She is alert.  ?Psychiatric:     ?   Mood and Affect: Mood normal.     ?   Behavior: Behavior normal.     ?  Thought Content: Thought content normal.  ? ? ? ?UC Treatments /  Results  ?Labs ?(all labs ordered are listed, but only abnormal results are displayed) ?Labs Reviewed - No data to display ? ?EKG ? ? ?Radiology ?No results found. ? ?Procedures ?Procedures (including critical care time) ? ?Medications Ordered in UC ?Medications - No data to display ? ?Initial Impression / Assessment and Plan / UC Course  ?I have reviewed the triage vital signs and the nursing notes. ? ?Pertinent labs & imaging results that were available during my care of the patient were reviewed by me and considered in my medical decision making (see chart for details). ? ?  ?Discussed bursitis vs arthritis. Recommend steroid burst and encouraged follow up if symptoms worsen or fail to improve. Discussed that steroid may affect glucose levels while she is taking- encourage her to monitor.  ? ?Final Clinical Impressions(s) / UC Diagnoses  ? ?Final diagnoses:  ?Acute pain of left shoulder  ? ?Discharge Instructions   ?None ?  ? ?ED Prescriptions   ? ? Medication Sig Dispense Auth. Provider  ? predniSONE (DELTASONE) 20 MG tablet Take 2 tablets (40 mg total) by mouth daily with breakfast for 5 days. 10 tablet Francene Finders, PA-C  ? ?  ? ?PDMP not reviewed this encounter. ?  ?Francene Finders, PA-C ?07/31/21 1911 ? ?

## 2021-07-31 NOTE — ED Triage Notes (Signed)
Pt presents with left shoulder pain for past few days with no known injury.  ?

## 2022-09-24 ENCOUNTER — Ambulatory Visit: Payer: BC Managed Care – PPO | Admitting: Nurse Practitioner

## 2022-09-29 ENCOUNTER — Ambulatory Visit: Payer: 59 | Admitting: Family Medicine

## 2022-09-29 ENCOUNTER — Encounter: Payer: Self-pay | Admitting: Family Medicine

## 2022-09-29 VITALS — BP 142/86 | HR 88 | Temp 97.6°F | Ht 60.5 in | Wt 136.0 lb

## 2022-09-29 DIAGNOSIS — R350 Frequency of micturition: Secondary | ICD-10-CM | POA: Diagnosis not present

## 2022-09-29 DIAGNOSIS — E1169 Type 2 diabetes mellitus with other specified complication: Secondary | ICD-10-CM

## 2022-09-29 DIAGNOSIS — R631 Polydipsia: Secondary | ICD-10-CM

## 2022-09-29 DIAGNOSIS — E559 Vitamin D deficiency, unspecified: Secondary | ICD-10-CM

## 2022-09-29 DIAGNOSIS — E785 Hyperlipidemia, unspecified: Secondary | ICD-10-CM | POA: Diagnosis not present

## 2022-09-29 DIAGNOSIS — R634 Abnormal weight loss: Secondary | ICD-10-CM | POA: Insufficient documentation

## 2022-09-29 DIAGNOSIS — I10 Essential (primary) hypertension: Secondary | ICD-10-CM | POA: Diagnosis not present

## 2022-09-29 DIAGNOSIS — E1165 Type 2 diabetes mellitus with hyperglycemia: Secondary | ICD-10-CM | POA: Diagnosis not present

## 2022-09-29 LAB — LIPID PANEL
Cholesterol: 166 mg/dL (ref 0–200)
HDL: 46.7 mg/dL (ref >39.00)
LDL Cholesterol: 95 mg/dL (ref 0–99)
NonHDL: 119.53
Total CHOL/HDL Ratio: 4
Triglycerides: 124 mg/dL (ref 0.0–149.0)
VLDL: 24.8 mg/dL (ref 0.0–40.0)

## 2022-09-29 LAB — CBC WITH DIFFERENTIAL/PLATELET
Basophils Absolute: 0.1 10*3/uL (ref 0.0–0.1)
Basophils Relative: 0.6 % (ref 0.0–3.0)
Eosinophils Absolute: 0.1 10*3/uL (ref 0.0–0.7)
Eosinophils Relative: 0.6 % (ref 0.0–5.0)
HCT: 43.5 % (ref 36.0–46.0)
Hemoglobin: 14.5 g/dL (ref 12.0–15.0)
Lymphocytes Relative: 25.6 % (ref 12.0–46.0)
Lymphs Abs: 2.3 10*3/uL (ref 0.7–4.0)
MCHC: 33.2 g/dL (ref 30.0–36.0)
MCV: 85.6 fl (ref 78.0–100.0)
Monocytes Absolute: 0.3 10*3/uL (ref 0.1–1.0)
Monocytes Relative: 3.4 % (ref 3.0–12.0)
Neutro Abs: 6.2 10*3/uL (ref 1.4–7.7)
Neutrophils Relative %: 69.8 % (ref 43.0–77.0)
Platelets: 195 10*3/uL (ref 150.0–400.0)
RBC: 5.08 Mil/uL (ref 3.87–5.11)
RDW: 14.2 % (ref 11.5–15.5)
WBC: 8.9 10*3/uL (ref 4.0–10.5)

## 2022-09-29 LAB — COMPREHENSIVE METABOLIC PANEL
ALT: 24 U/L (ref 0–35)
AST: 19 U/L (ref 0–37)
Albumin: 4 g/dL (ref 3.5–5.2)
Alkaline Phosphatase: 71 U/L (ref 39–117)
BUN: 17 mg/dL (ref 6–23)
CO2: 27 mEq/L (ref 19–32)
Calcium: 9.5 mg/dL (ref 8.4–10.5)
Chloride: 102 mEq/L (ref 96–112)
Creatinine, Ser: 0.68 mg/dL (ref 0.40–1.20)
GFR: 93.26 mL/min (ref >60.00)
Glucose, Bld: 252 mg/dL — ABNORMAL HIGH (ref 70–99)
Potassium: 4.2 mEq/L (ref 3.5–5.1)
Sodium: 137 mEq/L (ref 135–145)
Total Bilirubin: 0.6 mg/dL (ref 0.2–1.2)
Total Protein: 7.8 g/dL (ref 6.0–8.3)

## 2022-09-29 LAB — URINALYSIS, ROUTINE W REFLEX MICROSCOPIC
Bilirubin Urine: NEGATIVE
Hgb urine dipstick: NEGATIVE
Ketones, ur: 15 — AB
Leukocytes,Ua: NEGATIVE
Nitrite: NEGATIVE
RBC / HPF: NONE SEEN (ref 0–?)
Specific Gravity, Urine: 1.02 (ref 1.000–1.030)
Total Protein, Urine: NEGATIVE
Urine Glucose: >=1000 — AB
Urobilinogen, UA: 0.2 (ref 0.0–1.0)
pH: 5.5 (ref 5.0–8.0)

## 2022-09-29 LAB — TSH: TSH: 0.87 u[IU]/mL (ref 0.35–5.50)

## 2022-09-29 LAB — MICROALBUMIN / CREATININE URINE RATIO
Creatinine,U: 45 mg/dL
Microalb Creat Ratio: 8.1 mg/g (ref 0.0–30.0)
Microalb, Ur: 3.7 mg/dL — ABNORMAL HIGH (ref 0.0–1.9)

## 2022-09-29 LAB — HEMOGLOBIN A1C: Hgb A1c MFr Bld: 13.6 % — ABNORMAL HIGH (ref 4.6–6.5)

## 2022-09-29 LAB — VITAMIN D 25 HYDROXY (VIT D DEFICIENCY, FRACTURES): VITD: 27.65 ng/mL — ABNORMAL LOW (ref 30.00–100.00)

## 2022-09-29 LAB — T4, FREE: Free T4: 1.12 ng/dL (ref 0.60–1.60)

## 2022-09-29 NOTE — Progress Notes (Signed)
New Patient Office Visit  Subjective    Patient ID: Sharon Kane, female    DOB: Jul 04, 1960  Age: 62 y.o. MRN: [id number]  CC:  Chief Complaint  Patient presents with   Establish Care    Diabetic, would like to check blood work   Hx of HTN, was taking Irbesartan but stopped.    HPI Sharon Kane presents to establish care No medical care since 2020.  [facility]  Eye doctor- overdue   Last Hgb A1c 11.8% in 2021.  Out of medications for at least 6 months.   States her BS last night was 271  Likes bread.   Hx of diabetes since 2009. She used to be on Tresiba, Humalog and metformin.  She has taken Basaglar in the past also.   HTN- out of BP mediation  Irbesartan in the past.   Lives with her husband.  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    Outpatient Encounter Medications as of 09/29/2022  Medication Sig   atorvastatin (LIPITOR) 20 MG tablet Take 20 mg by mouth daily.   Blood Glucose Monitoring Suppl (ONETOUCH VERIO IQ SYSTEM) w/Device KIT 1 Act by Does not apply route 2 (two) times daily. (Patient taking differently: 1 each by Does not apply route 2 (two) times daily. E11.9)   Cholecalciferol 50 MCG (2000 UT) TABS Take 1 tablet (2,000 Units total) by mouth daily.   glucose blood (ONETOUCH VERIO) test strip Dx: E11.8 Test up to BID (Patient taking differently: 1 each by Other route 2 (two) times daily. E11.9)   Insulin Pen Needle 31G X 5 MM MISC 1 Act by Does not apply route 4 (four) times daily. Use to help administer lantus insulin (Patient taking differently: 1 each by Does not apply route 4 (four) times daily. E11.9)   Lancets (ONETOUCH ULTRASOFT) lancets 1 each by Other route 2 (two) times daily. Use to help check blood sugars twice a day Dx E11.8 (Patient taking differently: 1 each by Other route 2 (two) times daily. E11.9)   insulin degludec (TRESIBA FLEXTOUCH) 200 UNIT/ML FlexTouch Pen Inject 80 Units into the skin daily.  (Patient not taking: Reported on 09/29/2022)   [DISCONTINUED] Insulin Glargine (BASAGLAR KWIKPEN) 100 UNIT/ML SOPN INJECT 0.3 MLS (30 UNITS TOTAL) INTO THE SKIN DAILY AT 10 PM.   No facility-administered encounter medications on file as of 09/29/2022.    Past Medical History:  Diagnosis Date   Diabetes mellitus without complication (HCC)    Hyperlipidemia     Past Surgical History:  Procedure Laterality Date   TUBAL LIGATION      Family History  Problem Relation Age of Onset   Hypertension Mother    Aneurysm Father    Early death Father    Diabetes Maternal Aunt    Cancer Neg Hx    COPD Neg Hx    Depression Neg Hx    Alcohol abuse Neg Hx    Drug abuse Neg Hx    Heart disease Neg Hx    Hyperlipidemia Neg Hx    Kidney disease Neg Hx     Social History   Socioeconomic History   Marital status: Married    Spouse name: Not on file   Number of children: Not on file   Years of education: Not on file   Highest education level: Not on file  Occupational History   Not on file  Tobacco Use   Smoking status: Never   Smokeless tobacco: Never  Vaping Use   Vaping Use: Never used  Substance and Sexual Activity   Alcohol use: No   Drug use: No   Sexual activity: Yes    Partners: Male    Birth control/protection: Post-menopausal  Other Topics Concern   Not on file  Social History Narrative   Not on file   Social Determinants of Health   Financial Resource Strain: Not on file  Food Insecurity: Not on file  Transportation Needs: Not on file  Physical Activity: Not on file  Stress: Not on file  Social Connections: Not on file  Intimate Partner Violence: Not on file    Review of Systems  Constitutional:  Positive for weight loss. Negative for chills, fever and malaise/fatigue.  Respiratory:  Negative for shortness of breath.   Cardiovascular:  Negative for chest pain, palpitations and leg swelling.  Gastrointestinal:  Negative for abdominal pain, constipation,  diarrhea, nausea and vomiting.  Genitourinary:  Positive for frequency. Negative for dysuria and urgency.  Neurological:  Negative for dizziness.  Endo/Heme/Allergies:  Positive for polydipsia.        Objective    BP (!) 142/86 (BP Location: Left Arm, Patient Position: Sitting, Cuff Size: Large)   Pulse 88   Temp 97.6 F (36.4 C) (Temporal)   Ht 5' 0.5" (1.537 m)   Wt 136 lb (61.7 kg)   LMP 05/03/2000   SpO2 99%   BMI 26.12 kg/m   Physical Exam Constitutional:      General: She is not in acute distress.    Appearance: She is not ill-appearing.  Eyes:     Extraocular Movements: Extraocular movements intact.     Conjunctiva/sclera: Conjunctivae normal.  Cardiovascular:     Rate and Rhythm: Normal rate and regular rhythm.  Pulmonary:     Effort: Pulmonary effort is normal.     Breath sounds: Normal breath sounds.  Musculoskeletal:     Cervical back: Normal range of motion and neck supple.     Right lower leg: No edema.     Left lower leg: No edema.  Skin:    General: Skin is warm and dry.  Neurological:     General: No focal deficit present.     Mental Status: She is alert and oriented to person, place, and time.  Psychiatric:        Mood and Affect: Mood normal.        Behavior: Behavior normal.        Thought Content: Thought content normal.         Assessment & Plan:   Problem List Items Addressed This Visit       Cardiovascular and Mediastinum   Essential hypertension   Relevant Orders   CBC with Differential/Platelet (Completed)   Comprehensive metabolic panel (Completed)     Endocrine   Hyperlipidemia associated with type 2 diabetes mellitus (HCC)   Relevant Orders   Lipid panel (Completed)   Uncontrolled type 2 diabetes mellitus with hyperglycemia (HCC) - Primary   Relevant Orders   Microalbumin / creatinine urine ratio (Completed)   CBC with Differential/Platelet (Completed)   Comprehensive metabolic panel (Completed)   TSH (Completed)    T4, free (Completed)   Urinalysis, Routine w reflex microscopic (Completed)   Hemoglobin A1c (Completed)     Other   Polydipsia   Relevant Orders   Hemoglobin A1c (Completed)   Urinary frequency   Relevant Orders   Urinalysis, Routine w reflex microscopic (Completed)   Vitamin D deficiency  Relevant Orders   VITAMIN D 25 Hydroxy (Vit-D Deficiency, Fractures) (Completed)   Weight loss   Relevant Orders   TSH (Completed)   T4, free (Completed)   Hemoglobin A1c (Completed)   She is new to the practice and here to establish care.  She has been out of all of her medications for at least 6 months.  History of uncontrolled diabetes.  Symptomatic.  No acute distress.  Check labs and urinalysis.  Restart medications as appropriate.   Return in about 4 weeks (around 10/27/2022) for chronic health conditions.   Hetty Blend, NP-C

## 2022-09-29 NOTE — Patient Instructions (Signed)
Please go downstairs for labs and urine tests before you leave.   Call and schedule with your OB/GYN office.   We will be in touch with your labs results and recommendations.

## 2022-10-01 ENCOUNTER — Telehealth: Payer: Self-pay | Admitting: Family Medicine

## 2022-10-01 ENCOUNTER — Other Ambulatory Visit: Payer: Self-pay | Admitting: Family Medicine

## 2022-10-01 ENCOUNTER — Other Ambulatory Visit: Payer: Self-pay

## 2022-10-01 DIAGNOSIS — E118 Type 2 diabetes mellitus with unspecified complications: Secondary | ICD-10-CM

## 2022-10-01 DIAGNOSIS — I10 Essential (primary) hypertension: Secondary | ICD-10-CM

## 2022-10-01 DIAGNOSIS — E1139 Type 2 diabetes mellitus with other diabetic ophthalmic complication: Secondary | ICD-10-CM

## 2022-10-01 MED ORDER — INSULIN LISPRO 100 UNIT/ML IJ SOLN
5.0000 [IU] | Freq: Every day | INTRAMUSCULAR | 1 refills | Status: AC
Start: 2022-10-01 — End: ?

## 2022-10-01 MED ORDER — IRBESARTAN 75 MG PO TABS
75.0000 mg | ORAL_TABLET | Freq: Every day | ORAL | 0 refills | Status: DC
Start: 2022-10-01 — End: 2022-10-25

## 2022-10-01 MED ORDER — ONETOUCH VERIO VI STRP
ORAL_STRIP | 3 refills | Status: AC
Start: 2022-10-01 — End: ?

## 2022-10-01 MED ORDER — METFORMIN HCL 1000 MG PO TABS
1000.0000 mg | ORAL_TABLET | Freq: Two times a day (BID) | ORAL | 3 refills | Status: DC
Start: 2022-10-01 — End: 2022-10-27

## 2022-10-01 MED ORDER — TRESIBA FLEXTOUCH 200 UNIT/ML ~~LOC~~ SOPN
20.0000 [IU] | PEN_INJECTOR | Freq: Every day | SUBCUTANEOUS | 2 refills | Status: AC
Start: 2022-10-01 — End: ?

## 2022-10-01 MED ORDER — ATORVASTATIN CALCIUM 20 MG PO TABS
20.0000 mg | ORAL_TABLET | Freq: Every day | ORAL | 3 refills | Status: AC
Start: 2022-10-01 — End: ?

## 2022-10-01 NOTE — Telephone Encounter (Signed)
Rx sent 

## 2022-10-01 NOTE — Telephone Encounter (Signed)
Patient nees her one touch verio test strips sent in to [facility] on [address] [place]

## 2022-10-01 NOTE — Telephone Encounter (Signed)
HTN med sent per [facility]

## 2022-10-01 NOTE — Progress Notes (Signed)
Please call her and let her now that her diabetes is uncontrolled with a Hgb A1c 13.6% I recommend starting back on Tresiba at 20 units once daily, metformin 1,000 mg twice daily with meals and Humalog 5 units with her largest meal of the day only for now. It is very important to monitor blood sugar at home. Does she have a meter? A CGM would be best.  Continue atorvastatin 20 mg. Follow up with me in June as scheduled. Bring in blood sugar readings.

## 2022-10-23 ENCOUNTER — Other Ambulatory Visit: Payer: Self-pay | Admitting: Family Medicine

## 2022-10-23 DIAGNOSIS — I10 Essential (primary) hypertension: Secondary | ICD-10-CM

## 2022-10-27 ENCOUNTER — Ambulatory Visit: Payer: 59 | Admitting: Family Medicine

## 2022-10-27 ENCOUNTER — Encounter: Payer: Self-pay | Admitting: Family Medicine

## 2022-10-27 VITALS — BP 126/72 | HR 80 | Temp 97.6°F | Ht 60.5 in | Wt 140.0 lb

## 2022-10-27 DIAGNOSIS — E785 Hyperlipidemia, unspecified: Secondary | ICD-10-CM

## 2022-10-27 DIAGNOSIS — I10 Essential (primary) hypertension: Secondary | ICD-10-CM | POA: Diagnosis not present

## 2022-10-27 DIAGNOSIS — Z7984 Long term (current) use of oral hypoglycemic drugs: Secondary | ICD-10-CM | POA: Diagnosis not present

## 2022-10-27 DIAGNOSIS — E1165 Type 2 diabetes mellitus with hyperglycemia: Secondary | ICD-10-CM | POA: Diagnosis not present

## 2022-10-27 DIAGNOSIS — E1169 Type 2 diabetes mellitus with other specified complication: Secondary | ICD-10-CM

## 2022-10-27 MED ORDER — DAPAGLIFLOZIN PROPANEDIOL 5 MG PO TABS
5.0000 mg | ORAL_TABLET | Freq: Every day | ORAL | 2 refills | Status: AC
Start: 2022-10-27 — End: ?

## 2022-10-27 NOTE — Progress Notes (Signed)
Subjective:     Patient ID: Sharon Kane, female    DOB: 09/09/1960, 62 y.o.   MRN: [id number]  Chief Complaint  Patient presents with   Medical Management of Chronic Issues    4 week f/u    HPI  Discussed the use of AI scribe software for clinical note transcription with the patient, who gave verbal consent to proceed.  History of Present Illness         Here for f/u of chronic medical conditions including uncontrolled DM. A1c 13.6% She was out of her medications for several months.   Now reports taking Tresiba 20 units daily, Humalog 5 units with supper.   She used to see endocrinology.   Metformin caused diarrhea and she stopped it.   Health Maintenance Due  Topic Date Due   Colonoscopy  Never done   MAMMOGRAM  03/06/2015   PAP SMEAR-Modifier  11/03/2019   FOOT EXAM  04/11/2020   OPHTHALMOLOGY EXAM  04/12/2020   COVID-19 Vaccine (4 - 2023-24 season) 01/01/2022    Past Medical History:  Diagnosis Date   Diabetes mellitus without complication (HCC)    Hyperlipidemia     Past Surgical History:  Procedure Laterality Date   TUBAL LIGATION      Family History  Problem Relation Age of Onset   Hypertension Mother    Aneurysm Father    Early death Father    Diabetes Maternal Aunt    Cancer Neg Hx    COPD Neg Hx    Depression Neg Hx    Alcohol abuse Neg Hx    Drug abuse Neg Hx    Heart disease Neg Hx    Hyperlipidemia Neg Hx    Kidney disease Neg Hx     Social History   Socioeconomic History   Marital status: Married    Spouse name: Not on file   Number of children: Not on file   Years of education: Not on file   Highest education level: Not on file  Occupational History   Not on file  Tobacco Use   Smoking status: Never   Smokeless tobacco: Never  Vaping Use   Vaping Use: Never used  Substance and Sexual Activity   Alcohol use: No   Drug use: No   Sexual activity: Yes    Partners: Male    Birth control/protection:  Post-menopausal  Other Topics Concern   Not on file  Social History Narrative   Not on file   Social Determinants of Health   Financial Resource Strain: Not on file  Food Insecurity: Not on file  Transportation Needs: Not on file  Physical Activity: Not on file  Stress: Not on file  Social Connections: Not on file  Intimate Partner Violence: Not on file    Outpatient Medications Prior to Visit  Medication Sig Dispense Refill   atorvastatin (LIPITOR) 20 MG tablet Take 1 tablet (20 mg total) by mouth daily. 90 tablet 3   Blood Glucose Monitoring Suppl (ONETOUCH VERIO IQ SYSTEM) w/Device KIT 1 Act by Does not apply route 2 (two) times daily. (Patient taking differently: 1 each by Does not apply route 2 (two) times daily. E11.9) 2 kit 0   Cholecalciferol 50 MCG (2000 UT) TABS Take 1 tablet (2,000 Units total) by mouth daily. 90 tablet 1   glucose blood (ONETOUCH VERIO) test strip Dx: E11.8 Test up to BID 200 each 3   insulin degludec (TRESIBA FLEXTOUCH) 200 UNIT/ML FlexTouch Pen Inject 20 Units  into the skin daily. 3 mL 2   insulin lispro (HUMALOG) 100 UNIT/ML injection Inject 0.05 mLs (5 Units total) into the skin daily with lunch. (or largest meal of the day). 3 mL 1   Insulin Pen Needle 31G X 5 MM MISC 1 Act by Does not apply route 4 (four) times daily. Use to help administer lantus insulin (Patient taking differently: 1 each by Does not apply route 4 (four) times daily. E11.9) 300 each 3   irbesartan (AVAPRO) 75 MG tablet Take 1 tablet (75 mg total) by mouth daily. Per MD Return in about 4 weeks (around 10/27/2022) for chronic health conditions. 90 tablet 0   Lancets (ONETOUCH ULTRASOFT) lancets 1 each by Other route 2 (two) times daily. Use to help check blood sugars twice a day Dx E11.8 (Patient taking differently: 1 each by Other route 2 (two) times daily. E11.9) 180 each 3   metFORMIN (GLUCOPHAGE) 1000 MG tablet Take 1 tablet (1,000 mg total) by mouth 2 (two) times daily with a meal.  180 tablet 3   No facility-administered medications prior to visit.    No Known Allergies  Review of Systems  Constitutional:  Negative for chills and fever.  Respiratory:  Negative for shortness of breath.   Cardiovascular:  Negative for chest pain, palpitations and leg swelling.  Gastrointestinal:  Positive for diarrhea. Negative for abdominal pain, constipation, nausea and vomiting.       With metformin  Genitourinary:  Negative for dysuria, frequency and urgency.  Neurological:  Negative for dizziness.       Objective:    Physical Exam Constitutional:      General: She is not in acute distress.    Appearance: She is not ill-appearing.  HENT:     Mouth/Throat:     Mouth: Mucous membranes are moist.  Eyes:     Extraocular Movements: Extraocular movements intact.     Conjunctiva/sclera: Conjunctivae normal.  Cardiovascular:     Rate and Rhythm: Normal rate.  Pulmonary:     Effort: Pulmonary effort is normal.  Musculoskeletal:     Cervical back: Normal range of motion and neck supple.  Skin:    General: Skin is warm and dry.  Neurological:     General: No focal deficit present.     Mental Status: She is alert and oriented to person, place, and time.  Psychiatric:        Mood and Affect: Mood normal.        Behavior: Behavior normal.        Thought Content: Thought content normal.      BP 126/72 (BP Location: Left Arm, Patient Position: Sitting, Cuff Size: Large)   Pulse 80   Temp 97.6 F (36.4 C) (Temporal)   Ht 5' 0.5" (1.537 m)   Wt 140 lb (63.5 kg)   LMP 05/03/2000   SpO2 100%   BMI 26.89 kg/m  Wt Readings from Last 3 Encounters:  10/27/22 140 lb (63.5 kg)  09/29/22 136 lb (61.7 kg)  03/17/21 148 lb (67.1 kg)       Assessment & Plan:   Problem List Items Addressed This Visit       Cardiovascular and Mediastinum   Essential hypertension     Endocrine   Hyperlipidemia associated with type 2 diabetes mellitus (HCC)   Relevant Medications    dapagliflozin propanediol (FARXIGA) 5 MG TABS tablet   Uncontrolled type 2 diabetes mellitus with hyperglycemia (HCC) - Primary   Relevant Medications  dapagliflozin propanediol (FARXIGA) 5 MG TABS tablet   Other Relevant Orders   Ambulatory referral to Endocrinology   Stopped metformin due to GI upset. Start [place]. Continue other medications.  Continue to work on diet and exercise.  Referral to endocrinology.   I have discontinued Tailynn F. Shadduck's metFORMIN. I am also having her start on dapagliflozin propanediol. Additionally, I am having her maintain her onetouch ultrasoft, OneTouch Verio IQ System, Insulin Pen Needle, Cholecalciferol, atorvastatin, Tresiba FlexTouch, insulin lispro, OneTouch Verio, and irbesartan.  Meds ordered this encounter  Medications   dapagliflozin propanediol (FARXIGA) 5 MG TABS tablet    Sig: Take 1 tablet (5 mg total) by mouth daily.    Dispense:  30 tablet    Refill:  2    Order Specific Question:   Supervising Provider    Answer:   Hillard Danker A [[id number]]

## 2022-10-27 NOTE — Patient Instructions (Addendum)
Please schedule an eye exam.   Stop Metformin and start Farxiga.   Continue monitoring your blood sugars fasting and 2 hours after meals.   Humalog should be with your meal.   I have referred you back to endocrinology and they will call you to schedule a visit.

## 2022-12-01 ENCOUNTER — Encounter (INDEPENDENT_AMBULATORY_CARE_PROVIDER_SITE_OTHER): Payer: Self-pay

## 2022-12-23 LAB — HM DIABETES EYE EXAM

## 2022-12-24 NOTE — Progress Notes (Signed)
Please reach out and ask her to schedule with endocrinology for her diabetes. They sent her a letter per their note. I will still see her for chronic health conditions at her regularly scheduled visit.

## 2022-12-28 ENCOUNTER — Encounter: Payer: Self-pay | Admitting: Family Medicine

## 2022-12-28 ENCOUNTER — Ambulatory Visit: Payer: 59 | Admitting: Family Medicine

## 2022-12-28 VITALS — BP 130/72 | HR 80 | Temp 97.8°F | Ht 60.5 in | Wt 141.0 lb

## 2022-12-28 DIAGNOSIS — Z1211 Encounter for screening for malignant neoplasm of colon: Secondary | ICD-10-CM

## 2022-12-28 DIAGNOSIS — E1169 Type 2 diabetes mellitus with other specified complication: Secondary | ICD-10-CM | POA: Diagnosis not present

## 2022-12-28 DIAGNOSIS — E785 Hyperlipidemia, unspecified: Secondary | ICD-10-CM | POA: Diagnosis not present

## 2022-12-28 DIAGNOSIS — E1165 Type 2 diabetes mellitus with hyperglycemia: Secondary | ICD-10-CM | POA: Diagnosis not present

## 2022-12-28 DIAGNOSIS — Z7984 Long term (current) use of oral hypoglycemic drugs: Secondary | ICD-10-CM

## 2022-12-28 DIAGNOSIS — E11319 Type 2 diabetes mellitus with unspecified diabetic retinopathy without macular edema: Secondary | ICD-10-CM | POA: Diagnosis not present

## 2022-12-28 DIAGNOSIS — Z794 Long term (current) use of insulin: Secondary | ICD-10-CM

## 2022-12-28 DIAGNOSIS — I10 Essential (primary) hypertension: Secondary | ICD-10-CM

## 2022-12-28 LAB — COMPREHENSIVE METABOLIC PANEL
ALT: 16 U/L (ref 0–35)
AST: 13 U/L (ref 0–37)
Albumin: 3.7 g/dL (ref 3.5–5.2)
Alkaline Phosphatase: 69 U/L (ref 39–117)
BUN: 16 mg/dL (ref 6–23)
CO2: 29 mEq/L (ref 19–32)
Calcium: 9.2 mg/dL (ref 8.4–10.5)
Chloride: 104 mEq/L (ref 96–112)
Creatinine, Ser: 0.63 mg/dL (ref 0.40–1.20)
GFR: 94.83 mL/min (ref >60.00)
Glucose, Bld: 159 mg/dL — ABNORMAL HIGH (ref 70–99)
Potassium: 4.1 mEq/L (ref 3.5–5.1)
Sodium: 140 mEq/L (ref 135–145)
Total Bilirubin: 0.6 mg/dL (ref 0.2–1.2)
Total Protein: 7.4 g/dL (ref 6.0–8.3)

## 2022-12-28 LAB — LIPID PANEL
Cholesterol: 156 mg/dL (ref 0–200)
HDL: 40.6 mg/dL (ref >39.00)
LDL Cholesterol: 93 mg/dL (ref 0–99)
NonHDL: 115.51
Total CHOL/HDL Ratio: 4
Triglycerides: 112 mg/dL (ref 0.0–149.0)
VLDL: 22.4 mg/dL (ref 0.0–40.0)

## 2022-12-28 LAB — CBC WITH DIFFERENTIAL/PLATELET
Basophils Absolute: 0.1 10*3/uL (ref 0.0–0.1)
Basophils Relative: 0.9 % (ref 0.0–3.0)
Eosinophils Absolute: 0.1 10*3/uL (ref 0.0–0.7)
Eosinophils Relative: 0.9 % (ref 0.0–5.0)
HCT: 41.9 % (ref 36.0–46.0)
Hemoglobin: 13.8 g/dL (ref 12.0–15.0)
Lymphocytes Relative: 35.4 % (ref 12.0–46.0)
Lymphs Abs: 2.9 10*3/uL (ref 0.7–4.0)
MCHC: 33 g/dL (ref 30.0–36.0)
MCV: 85.9 fl (ref 78.0–100.0)
Monocytes Absolute: 0.3 10*3/uL (ref 0.1–1.0)
Monocytes Relative: 4 % (ref 3.0–12.0)
Neutro Abs: 4.7 10*3/uL (ref 1.4–7.7)
Neutrophils Relative %: 58.8 % (ref 43.0–77.0)
Platelets: 205 10*3/uL (ref 150.0–400.0)
RBC: 4.87 Mil/uL (ref 3.87–5.11)
RDW: 13.6 % (ref 11.5–15.5)
WBC: 8.1 10*3/uL (ref 4.0–10.5)

## 2022-12-28 LAB — POCT GLYCOSYLATED HEMOGLOBIN (HGB A1C): Hemoglobin A1C: 12.2 % — AB (ref 4.0–5.6)

## 2022-12-28 NOTE — Assessment & Plan Note (Signed)
Closely followed by ophthalmologist. Discussed importance of better glucose control to keep this from worsening.

## 2022-12-28 NOTE — Assessment & Plan Note (Signed)
Continue statin therapy. Check fasting lipids. LDL goal <70

## 2022-12-28 NOTE — Assessment & Plan Note (Signed)
Continue current regimen

## 2022-12-28 NOTE — Assessment & Plan Note (Signed)
Hemoglobin A1c 12.2%. Increase Tresiba to 25 units daily.  Start Humalog 5 units with supper. She was not clear on how or when to take this since our last visit.  Continue Farxiga daily.  Retinopathy.  She has been referred to endocrinology and has not scheduled. Advised her to call and schedule.

## 2022-12-28 NOTE — Patient Instructions (Addendum)
Please call to schedule with Endocrinology at [phone].   Your hemoglobin A1c is 12.2% and your diabetes is still not controlled. This puts you at risk for worsening health.   Please increase your Evaristo Bury to 25 units daily.   Take the Humalog 5 units with supper.   Continue Farxiga daily.    Please schedule with your OB/GYN and update your mammogram and pap smear for cancer screening purposes.   I have referred you to [facility] for colon cancer screening and they will call you to schedule.

## 2022-12-28 NOTE — Assessment & Plan Note (Signed)
She did not do this the last time she was referred. She is amenable to colonoscopy.

## 2022-12-28 NOTE — Progress Notes (Signed)
Subjective:     Patient ID: Sharon Kane, female    DOB: Feb 16, 1961, 62 y.o.   MRN: [id number]  Chief Complaint  Patient presents with   Diabetes    2 month fasting f/u BS 280 this morning, usually staying in the 200s    Diabetes Pertinent negatives for hypoglycemia include no dizziness. Pertinent negatives for diabetes include no chest pain and no polydipsia.    Discussed the use of AI scribe software for clinical note transcription with the patient, who gave verbal consent to proceed.  History of Present Illness         Unable to get into her Mychart account.   Here for follow up on uncontrolled DM and other chronic conditions.   She has not yet schedule with endocrinology.  She used to see Dr. Everardo All.   Taking Tresiba 20 units.  States she started [place] since our last visit.   States she has not been doing Humalog injections. States she forgets.   Metformin gave her GI upset.    States she takes all of her medication at night before she goes to bed.     Health Maintenance Due  Topic Date Due   Colonoscopy  Never done   MAMMOGRAM  03/06/2015   PAP SMEAR-Modifier  11/03/2019   FOOT EXAM  04/11/2020   COVID-19 Vaccine (4 - 2023-24 season) 01/01/2022   INFLUENZA VACCINE  12/02/2022    Past Medical History:  Diagnosis Date   Diabetes mellitus without complication (HCC)    Hyperlipidemia     Past Surgical History:  Procedure Laterality Date   TUBAL LIGATION      Family History  Problem Relation Age of Onset   Hypertension Mother    Aneurysm Father    Early death Father    Diabetes Maternal Aunt    Cancer Neg Hx    COPD Neg Hx    Depression Neg Hx    Alcohol abuse Neg Hx    Drug abuse Neg Hx    Heart disease Neg Hx    Hyperlipidemia Neg Hx    Kidney disease Neg Hx     Social History   Socioeconomic History   Marital status: Married    Spouse name: Not on file   Number of children: Not on file   Years of education: Not on  file   Highest education level: Not on file  Occupational History   Not on file  Tobacco Use   Smoking status: Never   Smokeless tobacco: Never  Vaping Use   Vaping status: Never Used  Substance and Sexual Activity   Alcohol use: No   Drug use: No   Sexual activity: Yes    Partners: Male    Birth control/protection: Post-menopausal  Other Topics Concern   Not on file  Social History Narrative   Not on file   Social Determinants of Health   Financial Resource Strain: Not on file  Food Insecurity: Not on file  Transportation Needs: Not on file  Physical Activity: Not on file  Stress: Not on file  Social Connections: Not on file  Intimate Partner Violence: Not on file    Outpatient Medications Prior to Visit  Medication Sig Dispense Refill   atorvastatin (LIPITOR) 20 MG tablet Take 1 tablet (20 mg total) by mouth daily. 90 tablet 3   Blood Glucose Monitoring Suppl (ONETOUCH VERIO IQ SYSTEM) w/Device KIT 1 Act by Does not apply route 2 (two) times daily. (Patient taking differently:  1 each by Does not apply route 2 (two) times daily. E11.9) 2 kit 0   Cholecalciferol 50 MCG (2000 UT) TABS Take 1 tablet (2,000 Units total) by mouth daily. 90 tablet 1   dapagliflozin propanediol (FARXIGA) 5 MG TABS tablet Take 1 tablet (5 mg total) by mouth daily. 30 tablet 2   glucose blood (ONETOUCH VERIO) test strip Dx: E11.8 Test up to BID 200 each 3   insulin degludec (TRESIBA FLEXTOUCH) 200 UNIT/ML FlexTouch Pen Inject 20 Units into the skin daily. 3 mL 2   insulin lispro (HUMALOG) 100 UNIT/ML injection Inject 0.05 mLs (5 Units total) into the skin daily with lunch. (or largest meal of the day). 3 mL 1   Insulin Pen Needle 31G X 5 MM MISC 1 Act by Does not apply route 4 (four) times daily. Use to help administer lantus insulin (Patient taking differently: 1 each by Does not apply route 4 (four) times daily. E11.9) 300 each 3   irbesartan (AVAPRO) 75 MG tablet Take 1 tablet (75 mg total) by  mouth daily. Per MD Return in about 4 weeks (around 10/27/2022) for chronic health conditions. 90 tablet 0   Lancets (ONETOUCH ULTRASOFT) lancets 1 each by Other route 2 (two) times daily. Use to help check blood sugars twice a day Dx E11.8 (Patient taking differently: 1 each by Other route 2 (two) times daily. E11.9) 180 each 3   No facility-administered medications prior to visit.    No Known Allergies  Review of Systems  Constitutional:  Negative for chills and fever.  Respiratory:  Negative for shortness of breath.   Cardiovascular:  Negative for chest pain, palpitations and leg swelling.  Gastrointestinal:  Negative for abdominal pain, constipation, diarrhea, nausea and vomiting.  Genitourinary:  Negative for dysuria, frequency and urgency.  Neurological:  Negative for dizziness and focal weakness.  Endo/Heme/Allergies:  Negative for polydipsia.       Objective:    Physical Exam Constitutional:      General: She is not in acute distress.    Appearance: She is not ill-appearing.  Eyes:     Extraocular Movements: Extraocular movements intact.     Conjunctiva/sclera: Conjunctivae normal.  Cardiovascular:     Rate and Rhythm: Normal rate.  Pulmonary:     Effort: Pulmonary effort is normal.  Musculoskeletal:     Cervical back: Normal range of motion and neck supple.     Right lower leg: No edema.     Left lower leg: No edema.  Skin:    General: Skin is warm and dry.  Neurological:     General: No focal deficit present.     Mental Status: She is alert and oriented to person, place, and time.  Psychiatric:        Mood and Affect: Mood normal.        Behavior: Behavior normal.        Thought Content: Thought content normal.      BP 130/72 (BP Location: Left Arm, Patient Position: Sitting, Cuff Size: Large)   Pulse 80   Temp 97.8 F (36.6 C) (Temporal)   Ht 5' 0.5" (1.537 m)   Wt 141 lb (64 kg)   LMP 05/03/2000   SpO2 100%   BMI 27.08 kg/m  Wt Readings from Last  3 Encounters:  12/28/22 141 lb (64 kg)  10/27/22 140 lb (63.5 kg)  09/29/22 136 lb (61.7 kg)       Assessment & Plan:   Problem List Items  Addressed This Visit       Cardiovascular and Mediastinum   Essential hypertension    Continue current regimen.       Relevant Orders   CBC with Differential/Platelet (Completed)   Comprehensive metabolic panel (Completed)     Endocrine   Diabetic retinopathy without macular edema associated with type 2 diabetes mellitus (HCC)    Closely followed by ophthalmologist. Discussed importance of better glucose control to keep this from worsening.       Relevant Orders   POCT glycosylated hemoglobin (Hb A1C) (Completed)   Hyperlipidemia associated with type 2 diabetes mellitus (HCC)    Continue statin therapy. Check fasting lipids. LDL goal <70      Relevant Orders   POCT glycosylated hemoglobin (Hb A1C) (Completed)   Lipid panel (Completed)   Uncontrolled type 2 diabetes mellitus with hyperglycemia (HCC) - Primary    Hemoglobin A1c 12.2%. Increase Tresiba to 25 units daily.  Start Humalog 5 units with supper. She was not clear on how or when to take this since our last visit.  Continue Farxiga daily.  Retinopathy.  She has been referred to endocrinology and has not scheduled. Advised her to call and schedule.        Relevant Orders   POCT glycosylated hemoglobin (Hb A1C) (Completed)   CBC with Differential/Platelet (Completed)   Comprehensive metabolic panel (Completed)     Other   Colon cancer screening    She did not do this the last time she was referred. She is amenable to colonoscopy.       Relevant Orders   Ambulatory referral to Gastroenterology    I am having Khila F. Eagles maintain her onetouch ultrasoft, OneTouch Verio IQ System, Insulin Pen Needle, Cholecalciferol, atorvastatin, Tresiba FlexTouch, insulin lispro, OneTouch Verio, irbesartan, and dapagliflozin propanediol.  No orders of the defined types were  placed in this encounter.
